# Patient Record
Sex: Male | Born: 1965 | Race: White | Hispanic: No | Smoking: Current every day smoker
Health system: Southern US, Community
[De-identification: ages and names within clinical notes are randomized; demographics above are authoritative.]

## PROBLEM LIST (undated history)

## (undated) DIAGNOSIS — M1990 Unspecified osteoarthritis, unspecified site: Secondary | ICD-10-CM

## (undated) DIAGNOSIS — E669 Obesity, unspecified: Secondary | ICD-10-CM

## (undated) DIAGNOSIS — E079 Disorder of thyroid, unspecified: Secondary | ICD-10-CM

## (undated) DIAGNOSIS — G809 Cerebral palsy, unspecified: Secondary | ICD-10-CM

## (undated) DIAGNOSIS — I10 Essential (primary) hypertension: Secondary | ICD-10-CM

## (undated) HISTORY — PX: TOOTH EXTRACTION: SUR596

## (undated) HISTORY — DX: Obesity, unspecified: E66.9

---

## 2001-01-31 ENCOUNTER — Emergency Department (HOSPITAL_COMMUNITY): Admission: EM | Admit: 2001-01-31 | Discharge: 2001-01-31 | Payer: Self-pay | Admitting: Emergency Medicine

## 2007-02-13 ENCOUNTER — Ambulatory Visit: Payer: Self-pay | Admitting: Family Medicine

## 2007-02-13 DIAGNOSIS — E039 Hypothyroidism, unspecified: Secondary | ICD-10-CM

## 2007-02-13 DIAGNOSIS — G4730 Sleep apnea, unspecified: Secondary | ICD-10-CM | POA: Insufficient documentation

## 2007-02-13 DIAGNOSIS — F341 Dysthymic disorder: Secondary | ICD-10-CM

## 2007-02-13 DIAGNOSIS — G809 Cerebral palsy, unspecified: Secondary | ICD-10-CM | POA: Insufficient documentation

## 2007-03-06 ENCOUNTER — Encounter (INDEPENDENT_AMBULATORY_CARE_PROVIDER_SITE_OTHER): Payer: Self-pay | Admitting: Family Medicine

## 2008-04-12 ENCOUNTER — Emergency Department: Payer: Self-pay | Admitting: Emergency Medicine

## 2008-09-16 ENCOUNTER — Ambulatory Visit (HOSPITAL_COMMUNITY): Admission: RE | Admit: 2008-09-16 | Discharge: 2008-09-16 | Payer: Self-pay | Admitting: Neurosurgery

## 2009-01-14 ENCOUNTER — Emergency Department (HOSPITAL_COMMUNITY): Admission: EM | Admit: 2009-01-14 | Discharge: 2009-01-14 | Payer: Self-pay | Admitting: Emergency Medicine

## 2009-03-12 ENCOUNTER — Emergency Department (HOSPITAL_COMMUNITY): Admission: EM | Admit: 2009-03-12 | Discharge: 2009-03-12 | Payer: Self-pay | Admitting: Emergency Medicine

## 2009-05-07 ENCOUNTER — Emergency Department (HOSPITAL_COMMUNITY): Admission: EM | Admit: 2009-05-07 | Discharge: 2009-05-07 | Payer: Self-pay | Admitting: Emergency Medicine

## 2009-05-22 ENCOUNTER — Ambulatory Visit: Payer: Self-pay | Admitting: Family Medicine

## 2009-05-22 DIAGNOSIS — F17200 Nicotine dependence, unspecified, uncomplicated: Secondary | ICD-10-CM | POA: Insufficient documentation

## 2009-05-22 DIAGNOSIS — I10 Essential (primary) hypertension: Secondary | ICD-10-CM

## 2009-05-22 LAB — CONVERTED CEMR LAB
ALT: 19 units/L (ref 0–53)
Alkaline Phosphatase: 64 units/L (ref 39–117)
CO2: 23 meq/L (ref 19–32)
Creatinine, Ser: 1.16 mg/dL (ref 0.40–1.50)
Sodium: 142 meq/L (ref 135–145)
Total Bilirubin: 0.5 mg/dL (ref 0.3–1.2)

## 2009-06-02 ENCOUNTER — Telehealth: Payer: Self-pay | Admitting: *Deleted

## 2009-07-07 ENCOUNTER — Ambulatory Visit: Payer: Self-pay | Admitting: Family Medicine

## 2009-07-07 LAB — CONVERTED CEMR LAB
Cholesterol: 217 mg/dL — ABNORMAL HIGH (ref 0–200)
LDL Cholesterol: 159 mg/dL — ABNORMAL HIGH (ref 0–99)
Total CHOL/HDL Ratio: 6.2
VLDL: 23 mg/dL (ref 0–40)

## 2009-08-25 ENCOUNTER — Ambulatory Visit: Payer: Self-pay | Admitting: Family Medicine

## 2009-08-25 DIAGNOSIS — N529 Male erectile dysfunction, unspecified: Secondary | ICD-10-CM | POA: Insufficient documentation

## 2009-09-23 ENCOUNTER — Encounter: Payer: Self-pay | Admitting: Family Medicine

## 2009-09-25 ENCOUNTER — Ambulatory Visit: Payer: Self-pay | Admitting: Family Medicine

## 2009-09-25 LAB — CONVERTED CEMR LAB
BUN: 7 mg/dL (ref 6–23)
Calcium: 9.3 mg/dL (ref 8.4–10.5)
Glucose, Bld: 94 mg/dL (ref 70–99)
Sodium: 142 meq/L (ref 135–145)
TSH: 2.374 microintl units/mL (ref 0.350–4.500)

## 2009-09-26 ENCOUNTER — Encounter: Payer: Self-pay | Admitting: Family Medicine

## 2009-10-03 ENCOUNTER — Encounter: Payer: Self-pay | Admitting: Family Medicine

## 2010-06-17 ENCOUNTER — Ambulatory Visit: Payer: Self-pay | Admitting: Family Medicine

## 2010-07-01 ENCOUNTER — Telehealth: Payer: Self-pay | Admitting: Family Medicine

## 2010-07-01 ENCOUNTER — Ambulatory Visit: Payer: Self-pay | Admitting: Family Medicine

## 2010-07-01 LAB — CONVERTED CEMR LAB
ALT: 17 units/L (ref 0–53)
AST: 16 units/L (ref 0–37)
Chloride: 104 meq/L (ref 96–112)
Creatinine, Ser: 0.95 mg/dL (ref 0.40–1.50)
Sodium: 142 meq/L (ref 135–145)
Total Bilirubin: 0.4 mg/dL (ref 0.3–1.2)

## 2010-07-10 ENCOUNTER — Telehealth: Payer: Self-pay | Admitting: *Deleted

## 2010-08-10 ENCOUNTER — Ambulatory Visit: Payer: Self-pay | Admitting: Family Medicine

## 2010-08-10 DIAGNOSIS — H5310 Unspecified subjective visual disturbances: Secondary | ICD-10-CM | POA: Insufficient documentation

## 2010-08-25 ENCOUNTER — Telehealth: Payer: Self-pay | Admitting: *Deleted

## 2010-08-27 ENCOUNTER — Encounter: Payer: Self-pay | Admitting: Family Medicine

## 2010-09-13 ENCOUNTER — Encounter: Payer: Self-pay | Admitting: Neurosurgery

## 2010-09-23 NOTE — Letter (Signed)
Summary: Generic Letter  [facility]  [address]   [place], [place] [address]   Phone: [phone]  Fax: [phone]    09/26/2009  JARTAVIOUS MCKIMMY [address] [place], [place]  [address]  Dear Mr. Deckard,   All your blood tests were normal.     Good luck with stopping smoking!!    Sincerely,   Pearlean Brownie MD  Appended Document: Generic Letter mailed.

## 2010-09-23 NOTE — Progress Notes (Signed)
Summary: phn msg  Phone Note Call from Patient Call back at Home Phone [phone]   Caller: Patient Summary of Call: pt is asking about pain mgmt Initial call taken by: De Nurse,  July 10, 2010 11:16 AM  Follow-up for Phone Call        Advised that we sent the referral on the 15th and that he should hear from them no later than next week.  Pt would like to know if he can be given something else for pain because he is taking two 800mg  ibuprofen at least two times a day. Advised pt that I would have to send message to MD for a med change. Follow-up by: Jone Baseman CMA,  July 10, 2010 11:31 AM  Additional Follow-up for Phone Call Additional follow up Details #1::        see referral note and recent phone call note that he was denied being seen at [facility] Additional Follow-up by: Pearlean Brownie MD,  July 10, 2010 2:10 PM    Additional Follow-up for Phone Call Additional follow up Details #2::    ok.  I can let him know that but what about the 1600mg s ibuprofen a day? Follow-up by: Jone Baseman CMA,  July 10, 2010 4:32 PM  Additional Follow-up for Phone Call Additional follow up Details #3:: Details for Additional Follow-up Action Taken: NO more than 800 mg three times a day and he can make an apt to discuss his pain  Additional Follow-up by: Pearlean Brownie MD,  July 13, 2010 3:41 PM  Pts wife informed of the above.  She will relay message. ............................................... Shanda Bumps [facility] July 13, 2010 3:50 PM

## 2010-09-23 NOTE — Assessment & Plan Note (Signed)
Summary: f/u  kh   Vital Signs:  Patient profile:   45 year old male Height:      64.5 inches Weight:      188.25 pounds BMI:     31.93 BSA:     1.92 Temp:     98.3 degrees F Pulse rate:   72 / minute BP sitting:   160 / 102  Vitals Entered By: Jone Baseman, CMA (July 01, 2010 11:22 AM) CC: f/u Is Patient Diabetic? No Pain Assessment Patient in pain? yes     Location: all over Intensity: 8   Primary Care Provider:  Pearlean Brownie MD  CC:  f/u.  History of Present Illness: Current Problems:  HYPERTENSION (ICD-401.9) taking his medications and knows the doses.  No chest pain or shortness of breath or lightheadedness  HYPOTHYROIDISM (ICD-244.9) taking replacement.  No heat intolerance or hair loss  SLEEP APNEA (ICD-780.57) needs his mask replaced is broken.  He feels that he does not rest well and may increase his blood pressure   DEGENERATIVE DISC DISEASE - C-SPINE AND L-SPINE (ICD-722.6) continues with pain in his back.  Would like referral to pain center.  Has taken nsaids before that helped.  No joint swelling or redness   ROS - as above PMH - Medications reviewed and updated in medication list.  Smoking Status noted in VS form    Habits & Providers  Alcohol-Tobacco-Diet     Tobacco Status: current     Tobacco Counseling: to quit use of tobacco products     Cigarette Packs/Day: 1.0  Current Medications (verified): 1)  Levothroid 50 Mcg Tabs (Levothyroxine Sodium) .Marland Kitchen.. 1 Daily For Thyroid 2)  Baclofen 20 Mg  Tabs (Baclofen) .Marland Kitchen.. 1 Three Times A Day For Spasm 3)  Omeprazole 20 Mg Cpdr (Omeprazole) .... One By Mouth Daily For Gerd 4)  Lisinopril 40 Mg Tabs (Lisinopril) .Marland Kitchen.. 1 By Mouth Two Times A Day 5)  Amlodipine Besylate 5 Mg  Tabs (Amlodipine Besylate) .Marland Kitchen.. 1 By Mouth Every Day For Blood Pressure  Allergies: 1)  ! Penicillin  Physical Exam  General:  Well-developed,well-nourished,in no acute distress; alert,appropriate and cooperative  throughout examination.  Able to move around and get up and down from exam table without assistance  Lungs:  Normal respiratory effort, chest expands symmetrically. Lungs are clear to auscultation, no crackles or wheezes. Heart:  Normal rate and regular rhythm. S1 and S2 normal without gallop, murmur, click, rub or other extra sounds.   Impression & Recommendations:  Problem # 1:  HYPERTENSION (ICD-401.9) not well controlled.  Increase amlodipine.  Check labs.  May be due to sleep apnea since not able to use his cpap His updated medication list for this problem includes:    Lisinopril 40 Mg Tabs (Lisinopril) .Marland Kitchen... 1 by mouth two times a day    Amlodipine Besylate 10 Mg Tabs (Amlodipine besylate) .Marland Kitchen... 1 daily for blood pressure  Orders: [facility] ([address]) [facility]- Est  Level 4 ([address])  BP today: 160/102 Prior BP: 168/108 (06/17/2010)  Labs Reviewed: K+: 4.1 (09/25/2009) Creat: : 0.97 (09/25/2009)   Chol: 217 (07/07/2009)   HDL: 35 (07/07/2009)   LDL: 159 (07/07/2009)   TG: 116 (07/07/2009)  Problem # 2:  SLEEP APNEA (ICD-780.57)  we unfortunately do not have records of his sleep study or settings.  Will need to obtain in order to meet requirements to reorder   Orders: [facility]- Est  Level 4 ([address]56)  Problem # 3:  DEGENERATIVE DISC DISEASE -  C-SPINE AND L-SPINE (ICD-722.6)  He will call us with name of pain center.  Will treat with ibuprofen   Orders: [facility]- Est  Level 4 ([address])  Complete Medication List: 1)  Levothroid 50 Mcg Tabs (Levothyroxine sodium) .Marland Kitchen.. 1 daily for thyroid 2)  Baclofen 20 Mg Tabs (Baclofen) .Marland Kitchen.. 1 three times a day for spasm 3)  Omeprazole 20 Mg Cpdr (Omeprazole) .... One by mouth daily for gerd 4)  Lisinopril 40 Mg Tabs (Lisinopril) .Marland Kitchen.. 1 by mouth two times a day 5)  Amlodipine Besylate 10 Mg Tabs (Amlodipine besylate) .Marland Kitchen.. 1 daily for blood pressure 6)  Ibuprofen 800 Mg Tabs (Ibuprofen) .Marland Kitchen.. 1 three times a day as needed with food  Patient  Instructions: 1)  Please schedule a follow-up appointment in 1 month.  2)  I will call you if your lab is abnormal otherwise I will discuss the results during our next visit 3)  Call me with the name of the Pain Center 4)  I increased the dose of your amlodipine  Prescriptions: IBUPROFEN 800 MG TABS (IBUPROFEN) 1 three times a day as needed with food  #90 x 1   Entered and Authorized by:   Pearlean Brownie MD   Signed by:   Pearlean Brownie MD on 07/01/2010   Method used:   Electronically to        [facility] [address] [phone]* (retail)       [address]       [place], [place]  [address]       Ph: [phone]       Fax: [phone]   RxID:   [id number] AMLODIPINE BESYLATE 10 MG TABS (AMLODIPINE BESYLATE) 1 daily for blood pressure  #30 x 1   Entered and Authorized by:   Pearlean Brownie MD   Signed by:   Pearlean Brownie MD on 07/01/2010   Method used:   Electronically to        [facility] [address] [phone]* (retail)       [address]       [place], [place]  [address]       Ph: [phone]       Fax: [phone]   RxID:   [id number]    Orders Added: 1)  Comp [facility] [[address]3235] 2)  [facility]- Est  Level 4 [[address]]

## 2010-09-23 NOTE — Consult Note (Signed)
Summary: [facility]   Imported By: Clydell Hakim 10/07/2009 15:16:01  _____________________________________________________________________  External Attachment:    Type:   Image     Comment:   External Document

## 2010-09-23 NOTE — Assessment & Plan Note (Signed)
Summary: F/U  KH   Vital Signs:  Patient profile:   45 year old male Height:      64.5 inches Weight:      180 pounds BMI:     30.53 Temp:     97.8 degrees F oral Pulse rate:   57 / minute BP sitting:   168 / 108  (left arm) Cuff size:   regular  Vitals Entered By: Garen Grams LPN (June 17, 2010 11:07 AM) CC: f/u Is Patient Diabetic? No   Primary Care Idalia Allbritton:  Pearlean Brownie MD  CC:  f/u.  History of Present Illness: HTN Disease Monitoring   Blood pressure range:not checking       Chest pain: N     Dyspnea:N Medications   Compliance: has been out of his medications for weeks.   Lightheadedness: N     Edema:N  HYPOTHYROIDISM (ICD-244.9) has been feeling tired. Out of his thyroid replacement for weeks.    DEGENERATIVE DISC DISEASE - C-SPINE AND L-SPINE (ICD-722.6) has pain in back as usual. Out of pain medications. Was discharged from pain center in [place] for No Show.  Was at pain center in [place] where he lived for the last 6 months but now is back in [place].     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  Reports his spasms are worse without it   ROS - as above PMH - Medications reviewed and updated in medication list.  Smoking Status noted in VS form  SH living in a relatives home.  No income.       Habits & Providers  Alcohol-Tobacco-Diet     Tobacco Status: current     Tobacco Counseling: to quit use of tobacco products     Cigarette Packs/Day: 1.0  Allergies: 1)  ! Penicillin  Physical Exam  General:  Well-developed,well-nourished,in no acute distress; alert,appropriate and cooperative throughout examination Lungs:  Normal respiratory effort, chest expands symmetrically. Lungs are clear to auscultation, no crackles or wheezes. Heart:  Normal rate and regular rhythm. S1 and S2 normal without gallop, murmur, click, rub or other extra sounds. Extremities:  no edema Neurologic:  able to get up and down from exam table quickly without asst and without  obvious pain    Impression & Recommendations:  Problem # 1:  HYPERTENSION (ICD-401.9) Assessment Deteriorated  Poorly controlled because out of medications.  He will contact [employer] to help with housing and assistance.  I represcribed all his medications.  Will needs labs next visit  His updated medication list for this problem includes:    Lisinopril 40 Mg Tabs (Lisinopril) .Marland Kitchen... 1 by mouth two times a day    Amlodipine Besylate 5 Mg Tabs (Amlodipine besylate) .Marland Kitchen... 1 by mouth every day for blood pressure  BP today: 168/108 Prior BP: 149/91 (09/25/2009)  Labs Reviewed: K+: 4.1 (09/25/2009) Creat: : 0.97 (09/25/2009)   Chol: 217 (07/07/2009)   HDL: 35 (07/07/2009)   LDL: 159 (07/07/2009)   TG: 116 (07/07/2009)  Orders: [facility]- Est  Level 4 ([address])  Problem # 2:  HYPOTHYROIDISM (ICD-244.9)  restart replacement His updated medication list for this problem includes:    Levothroid 50 Mcg Tabs (Levothyroxine sodium) .Marland Kitchen... 1 daily for thyroid  Orders: [facility]- Est  Level 4 ([address]24)  Problem # 3:  DEGENERATIVE DISC DISEASE - C-SPINE AND L-SPINE (ICD-722.6) Assessment: Unchanged  He seems to be able to move relatively well despite being out of analgesics.  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 I suggested he contact Pain Centers and that I would refer  him once his blood pressure was better conterolled.  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  Will restart baclofen   Complete Medication List: 1)  Levothroid 50 Mcg Tabs (Levothyroxine sodium) .Marland Kitchen.. 1 daily for thyroid 2)  Baclofen 20 Mg Tabs (Baclofen) .Marland Kitchen.. 1 three times a day for spasm 3)  Omeprazole 20 Mg Cpdr (Omeprazole) .... One by mouth daily for gerd 4)  Lisinopril 40 Mg Tabs (Lisinopril) .Marland Kitchen.. 1 by mouth two times a day 5)  Amlodipine Besylate 5 Mg Tabs (Amlodipine besylate) .Marland Kitchen.. 1 by mouth every day for blood  pressure  Patient Instructions: 1)  Please schedule a follow-up appointment in 2-4 weeks.  2)  Take all your medications as prescribed 3)  Once we get your blood pressure under control we will refer you to a pain center and neurology Prescriptions: BACLOFEN 20 MG  TABS (BACLOFEN) 1 three times a day for spasm  #90 x 3   Entered and Authorized by:   Pearlean Brownie MD   Signed by:   Pearlean Brownie MD on 06/17/2010   Method used:   Electronically to        [facility] [address] [phone]* (retail)       [address]       [place], [place]  [address]       Ph: [phone]       Fax: [phone]   RxID:   [id number] AMLODIPINE BESYLATE 5 MG  TABS (AMLODIPINE BESYLATE) 1 by mouth every day for blood pressure  #30 x 3   Entered and Authorized by:   Pearlean Brownie MD   Signed by:   Pearlean Brownie MD on 06/17/2010   Method used:   Electronically to        [facility] [address] [phone]* (retail)       [address]       [place], [place]  [address]       Ph: [phone]       Fax: [phone]   RxID:   [id number] LISINOPRIL 40 MG TABS (LISINOPRIL) 1 by mouth two times a day  #30 x 3   Entered and Authorized by:   Pearlean Brownie MD   Signed by:   Pearlean Brownie MD on 06/17/2010   Method used:   Electronically to        [facility] [address] [phone]* (retail)       [address]       [place], [place]  [address]       Ph: [phone]       Fax: [phone]   RxID:   [id number] OMEPRAZOLE 20 MG CPDR (OMEPRAZOLE) one by mouth daily for GERD  #30 x 3   Entered and Authorized by:   Pearlean Brownie MD   Signed by:   Pearlean Brownie MD on 06/17/2010   Method used:   Electronically to        [facility] [address] [phone]* (retail)       [address]       [place], [place]  [address]       Ph: [phone]       Fax: [phone]   RxID:   [id number] LEVOTHROID 50 MCG TABS (LEVOTHYROXINE SODIUM) 1 daily for thyroid  #30 x 3   Entered and  Authorized by:    Pearlean Brownie MD   Signed by:   Pearlean Brownie MD on 06/17/2010   Method used:   Electronically to        [facility] [address] [phone]* (retail)       [address]       [place], [place]  [address]       Ph: [phone]       Fax: [phone]   RxID:   [id number]    Orders Added: 1)  [facility]- Est  Level 4 [[address]]

## 2010-09-23 NOTE — Assessment & Plan Note (Signed)
Summary: f/u eo   Vital Signs:  Patient profile:   45 year old male Height:      64.5 inches Weight:      192.7 pounds BMI:     32.68 Temp:     98.4 degrees F oral Pulse rate:   86 / minute BP sitting:   149 / 91  (left arm) Cuff size:   large  Vitals Entered By: Garen Grams LPN (September 25, 2009 11:25 AM) CC: f/u BP Is Patient Diabetic? No Pain Assessment Patient in pain? no        Primary Care Provider:  Pearlean Brownie MD  CC:  f/u BP.  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   HYPERTENSION (ICD-401.9) HYPERTENSION Disease Monitoring   Blood pressure range: not checking      Chest pain: N     Dyspnea:N Medications   Compliance: daily   Lightheadedness: N     Edema:N  HYPOTHYROIDISM (ICD-244.9) back on medication.  No heat sensitivity or hair changes  ROS - as above PMH - Medications reviewed and updated in medication list.  Smoking Status noted in VS form    Habits & Providers  Alcohol-Tobacco-Diet     Tobacco Status: current     Cigarette Packs/Day: 1.0  Current Medications (verified): 1)  Levothroid 50 Mcg Tabs (Levothyroxine Sodium) .Marland Kitchen.. 1 Daily For Thyroid 2)  Baclofen 20 Mg  Tabs (Baclofen) .Marland Kitchen.. 1 Three Times A Day For Spasm 3)  Omeprazole 20 Mg Cpdr (Omeprazole) .... One By Mouth Daily For Gerd 4)  Clonazepam 1 Mg Tabs (Clonazepam) .Marland Kitchen.. 1 By Mouth Two Times A Day For Anxiety 5)  Hydrocodone-Acetaminophen 10-325 Mg Tabs (Hydrocodone-Acetaminophen) .... Qid Per [facility] 6)  Lisinopril 40 Mg Tabs (Lisinopril) .Marland Kitchen.. 1 By Mouth Two Times A Day 7)  Amlodipine Besylate 5 Mg  Tabs (Amlodipine Besylate) .Marland Kitchen.. 1 By Mouth Every Day For Blood Pressure  Allergies: 1)  ! Penicillin  Physical Exam  General:  Well-developed,well-nourished,in no acute distress; alert,appropriate and  cooperative throughout examination Neurologic:  Finger to nose normal bilaterally.  Can do coordinated flexion extension of ankles bilaterally normal strength    Impression & Recommendations:  Problem # 1:  ERECTILE DYSFUNCTION, ORGANIC (ICD-607.84) Assessment Improved  Orders: [facility]- Est  Level 4 ([address])  Problem # 2:  HYPERTENSION (ICD-401.9) Better but not adequate control.  Will add amlodipine and follow.  check labs for creatinine today  His updated medication list for this problem includes:    Lisinopril 40 Mg Tabs (Lisinopril) .Marland Kitchen... 1 by mouth two times a day    Amlodipine Besylate 5 Mg Tabs (Amlodipine besylate) .Marland Kitchen... 1 by mouth every day for blood pressure  Orders: [facility] ([address]) [facility]- Est  Level 4 ([address])  BP today: 149/91 Prior BP: 172/96 (08/25/2009)  Labs Reviewed: K+: 3.4 (05/22/2009) Creat: : 1.16 (05/22/2009)   Chol: 217 (07/07/2009)   HDL: 35 (07/07/2009)   LDL: 159 (07/07/2009)   TG: 116 (07/07/2009)  Problem # 3:  TOBACCO ABUSE (ICD-305.1) Had stoppd for 2 years with patch.  recommed he try that again  Problem # 4:  HYPOTHYROIDISM (ICD-244.9) Will check TSH  His updated medication list for this problem includes:    Levothroid 50 Mcg Tabs (Levothyroxine sodium) .Marland Kitchen... 1 daily for thyroid  Orders: [facility] ([address]-78469) [facility]- Est  Level 4 ([address])  Complete Medication List: 1)  Levothroid 50 Mcg Tabs (Levothyroxine sodium) .Marland Kitchen.. 1 daily for thyroid 2)  Baclofen 20 Mg Tabs (Baclofen) .Marland Kitchen.. 1 three times a day for spasm 3)  Omeprazole 20 Mg Cpdr (Omeprazole) .... One by mouth daily for gerd 4)  Clonazepam 1 Mg Tabs (Clonazepam) .Marland Kitchen.. 1 by mouth two times a day for anxiety 5)  Hydrocodone-acetaminophen 10-325 Mg Tabs (Hydrocodone-acetaminophen) .... Qid per [facility] 6)  Lisinopril 40 Mg Tabs (Lisinopril) .Marland Kitchen.. 1 by mouth two times a day 7)  Amlodipine Besylate 5 Mg Tabs (Amlodipine besylate) .Marland Kitchen.. 1 by mouth every day for blood  pressure  Patient Instructions: 1)  Please schedule a follow-up appointment in 2 months.  2)  Take the amlodipine every day with your lisinopril 3)  Call if feel really lightheaded when stand up 4)  I will call you if your lab is abnormal otherwise I will send you a letter within 2 weeks. 5)  Talk with [facility] about [facility]ant[facility] 6)  Use the patch to stop smoking 7)  Stop smoking tips: Choose a quit date. Cut down before the quit date. Decide what you will do as a substitute when you feel the urge to smoke(gum, toothpick, exercise).  Prescriptions: AMLODIPINE BESYLATE 5 MG  TABS (AMLODIPINE BESYLATE) 1 by mouth every day for blood pressure  #30 x 3   Entered and Authorized by:   Pearlean Brownie MD   Signed by:   Pearlean Brownie MD on 09/25/2009   Method used:   Electronically to        [facility]* (retail)       [address]       [place], [place]  [address]       Ph: [phone]       Fax: [phone]   RxID:   [id number]

## 2010-09-23 NOTE — Progress Notes (Signed)
Summary: Pain Center Referral - Declined to see him  Phone Note Call from Patient Call back at Home Phone [phone]   Caller: Patient Reason for Call: Lab or Test Results Summary of Call: called to give info -  [facility] [phone] Initial call taken by: De Nurse,  July 01, 2010 1:57 PM  Follow-up for Phone Call        Letter from Pain Center Dr Murray Hodgkins declined to see him Follow-up by: Pearlean Brownie MD,  July 09, 2010 5:09 PM

## 2010-09-23 NOTE — Assessment & Plan Note (Signed)
Summary: f/u,df   Vital Signs:  Patient profile:   45 year old male Height:      64.5 inches Weight:      187 pounds BMI:     31.72 BSA:     1.91 Temp:     98.4 degrees F Pulse rate:   65 / minute BP sitting:   172 / 96  Vitals Entered By: Jone Baseman CMA (August 25, 2009 1:38 PM) CC: f/u Is Patient Diabetic? No Pain Assessment Patient in pain? yes     Location: back Intensity: 4   Primary Care Provider:  Pearlean Brownie MD  CC:  f/u.  History of Present Illness: Impotence worse with norvasc he stopped that.  has had problems in the past.  would like to try Viagra.  He and his wife hope to be pregnant  HYPERTENSION (ICD-401.9) Disease Monitoring   Blood pressure range:no money for bp cuff      Chest pain: N     Dyspnea:N Medications   Compliance: stopped norvasc because of impotence   Lightheadedness: N     Edema:N Prevention   Exercise: walks some    Salt restriction:does not eat much  HYPOTHYROIDISM (ICD-244.9) out of medications for last month.  No change in hair or heat tolerance  TOBACCO ABUSE (ICD-305.1) not really interested in stopping ROS - as above PMH - Medications reviewed and updated in medication list.  Smoking Status noted in VS form      Habits & Providers  Alcohol-Tobacco-Diet     Tobacco Status: current     Tobacco Counseling: to quit use of tobacco products     Cigarette Packs/Day: 1.0  Current Medications (verified): 1)  Lisinopril-Hydrochlorothiazide 20-12.5 Mg  Tabs (Lisinopril-Hydrochlorothiazide) .... 2 By Mouth Once Daily 2)  Levothroid 50 Mcg Tabs (Levothyroxine Sodium) .Marland Kitchen.. 1 Daily For Thyroid 3)  Baclofen 20 Mg  Tabs (Baclofen) .Marland Kitchen.. 1 Three Times A Day For Spasm 4)  Omeprazole 20 Mg Cpdr (Omeprazole) .... One By Mouth Daily For Gerd 5)  Clonazepam 1 Mg Tabs (Clonazepam) .Marland Kitchen.. 1 By Mouth Two Times A Day For Anxiety 6)  Hydrocodone-Acetaminophen 10-325 Mg Tabs (Hydrocodone-Acetaminophen) .... Qid Per [facility]  [place]  Allergies: 1)  ! Penicillin  Social History: Packs/Day:  1.0  Physical Exam  General:  Well-developed,well-nourished,in no acute distress; alert,appropriate and cooperative throughout examination   Impression & Recommendations:  Problem # 1:  HYPERTENSION (ICD-401.9)  Not well controlled will change medications  and follow The following medications were removed from the medication list:    Lisinopril-hydrochlorothiazide 20-12.5 Mg Tabs (Lisinopril-hydrochlorothiazide) .Marland Kitchen... 2 by mouth once daily    Amlodipine Besylate 5 Mg Tabs (Amlodipine besylate) .Marland Kitchen... 1 daily His updated medication list for this problem includes:    Lisinopril 40 Mg Tabs (Lisinopril) .Marland Kitchen... 1 by mouth two times a day  BP today: 172/96 Prior BP: 182/111 (07/07/2009)  Labs Reviewed: K+: 3.4 (05/22/2009) Creat: : 1.16 (05/22/2009)   Chol: 217 (07/07/2009)   HDL: 35 (07/07/2009)   LDL: 159 (07/07/2009)   TG: 116 (07/07/2009)  Orders: [facility]- Est  Level 4 (81191)  Problem # 2:  ERECTILE DYSFUNCTION, ORGANIC (ICD-607.84)  will see if improves with change in blood pressure medications and restart thyroid replacement.  Told would not use viagra until blood pressure is under control due to risk of cva   Orders: [facility]- Est  Level 4 ([address]214)  Problem # 3:  HYPOTHYROIDISM (ICD-244.9) Assessment: Deteriorated  will need to resume medications  His  updated medication list for this problem includes:    Levothroid 50 Mcg Tabs (Levothyroxine sodium) .Marland Kitchen... 1 daily for thyroid  Orders: [facility]- Est  Level 4 ([address])  Problem # 4:  TOBACCO ABUSE (ICD-305.1) discussed advantages of stopping .  His wife smokes also   Complete Medication List: 1)  Levothroid 50 Mcg Tabs (Levothyroxine sodium) .Marland Kitchen.. 1 daily for thyroid 2)  Baclofen 20 Mg Tabs (Baclofen) .Marland Kitchen.. 1 three times a day for spasm 3)  Omeprazole 20 Mg Cpdr (Omeprazole) .... One by mouth daily for gerd 4)  Clonazepam 1 Mg Tabs (Clonazepam) .Marland Kitchen.. 1 by mouth two  times a day for anxiety 5)  Hydrocodone-acetaminophen 10-325 Mg Tabs (Hydrocodone-acetaminophen) .... Qid per [facility] [place] 6)  Lisinopril 40 Mg Tabs (Lisinopril) .Marland Kitchen.. 1 by mouth two times a day  Patient Instructions: 1)  Please schedule a follow-up appointment in 1 month.  2)  Take your pills every day same time of day 3)  Tobacco is very bad for your health and your loved ones ! You should stop smoking !  4)  Walk every day 5)  Cut back on any fatty foods or high cholesterol  Prescriptions: LEVOTHROID 50 MCG TABS (LEVOTHYROXINE SODIUM) 1 daily for thyroid  #90 x 3   Entered and Authorized by:   Pearlean Brownie MD   Signed by:   Pearlean Brownie MD on 08/25/2009   Method used:   Electronically to        [facility]* (retail)       [address] [place], [place]  [address]       Ph: [phone]       Fax: [phone]   RxID:   [id number] LISINOPRIL 40 MG TABS (LISINOPRIL) 1 by mouth two times a day  #60 x 2   Entered and Authorized by:   Pearlean Brownie MD   Signed by:   Pearlean Brownie MD on 08/25/2009   Method used:   Electronically to        [facility]* (retail)       [address]       [place], [place]  [address]       Ph: [phone]       Fax: [phone]   RxID:   [id number]    Prevention & Chronic Care Immunizations   Influenza vaccine: Fluvax MCR  (07/07/2009)    Tetanus booster: Not documented    Pneumococcal vaccine: Not documented  Other Screening   Smoking status: current  (08/25/2009)   Smoking cessation counseling: YES  (08/25/2009)  Lipids   Total Cholesterol: 217  (07/07/2009)   LDL: 159  (07/07/2009)   LDL Direct: Not documented   HDL: 35  (07/07/2009)   Triglycerides: 116  (07/07/2009)  Hypertension   Last Blood Pressure: 172 / 96  (08/25/2009)   Serum creatinine: 1.16  (05/22/2009)   Serum potassium 3.4  (05/22/2009)     Hypertension flowsheet reviewed?: Yes   Progress toward BP goal: Unchanged  Self-Management Support :   Personal Goals (by the next clinic visit) :      Personal blood pressure goal: 140/90  (07/07/2009)   Patient will work on the following items until the next clinic visit to reach self-care goals:     Medications and monitoring: take my medicines every day  (08/25/2009)     Eating:  drink diet soda or water instead of juice or soda  (08/25/2009)     Other: Add a salt substitute   (08/25/2009)    Hypertension self-management support: BP self-monitoring log, Written self-care plan  (08/25/2009)   Hypertension self-care plan printed.

## 2010-09-23 NOTE — Letter (Signed)
Summary: [place] [employer]  [place] [employer]   Imported By: Clydell Hakim 10/06/2009 14:56:14  _____________________________________________________________________  External Attachment:    Type:   Image     Comment:   External Document

## 2010-09-24 NOTE — Consult Note (Signed)
Summary: [facility]  [facility]   Imported By: De Nurse 09/02/2010 15:33:22  _____________________________________________________________________  External Attachment:    Type:   Image     Comment:   External Document

## 2010-09-24 NOTE — Progress Notes (Signed)
Summary: Referral  Phone Note Call from Patient Call back at Home Phone [phone]   Reason for Call: Referral Summary of Call: pt says MD found him another pain management doctor in [place], he needs to know the date & time of appt, did not see appt in EMR, also wants to know when hes suppose to see the eye doctor? Initial call taken by: Knox Royalty,  August 25, 2010 9:12 AM  Follow-up for Phone Call        Informed pt of appointment at [facility], i dont see anything about a pain center appt in [place], message to MD to see if he knows anything about this. Follow-up by: Garen Grams LPN,  August 25, 2010 11:41 AM  Additional Follow-up for Phone Call Additional follow up Details #1::        If you View  the Pain Center referral it looks like Tonya faxed it to a number that is likely the one in [place]?  thanks  [facility]    Additional Follow-up for Phone Call Additional follow up Details #2::    Appt scheduled with [facility] for 10/01/10 at 745am, patient informed. Follow-up by: Garen Grams LPN,  August 25, 2010 3:20 PM

## 2010-09-24 NOTE — Assessment & Plan Note (Signed)
Summary: F/U  KH   Vital Signs:  Patient profile:   45 year old male Height:      64.5 inches Weight:      191.2 pounds BMI:     32.43 Temp:     98.6 degrees F oral Pulse rate:   68 / minute BP sitting:   163 / 93  (right arm) Cuff size:   regular  Vitals Entered By: Jimmy Footman, CMA (August 10, 2010 2:21 PM) CC: BP follow up Is Patient Diabetic? No Pain Assessment Patient in pain? yes     Location: back Intensity: 8 Type: sharp   Primary Care Provider:  Pearlean Brownie MD  CC:  BP follow up.  History of Present Illness: Current Problems:  HYPERTENSION (ICD-401.9) taking his medications and knows the doses.  No chest pain or shortness of breath or lightheadedness  SLEEP APNEA (ICD-780.57) Still needs his mask replaced because is broken.  He feels that he does not rest well and may increase his blood pressure   DEGENERATIVE DISC DISEASE - C-SPINE AND L-SPINE (ICD-722.6) continues with pain in his back.  Would like referral to another pain center- knows one in [place].  He does not think the current meds help much and he is having to use his electric scooter more due to pain.  No joint swelling or redness  Vision seeing wavy lines since does not have his glasses which helped when he wore them in the past  ROS - as above PMH - Medications reviewed and updated in medication list.  Smoking Status noted in VS form    Habits & Providers  Alcohol-Tobacco-Diet     Tobacco Status: current  Current Medications (verified): 1)  Levothroid 50 Mcg Tabs (Levothyroxine Sodium) .Marland Kitchen.. 1 Daily For Thyroid 2)  Baclofen 20 Mg  Tabs (Baclofen) .Marland Kitchen.. 1 Three Times A Day For Spasm 3)  Omeprazole 20 Mg Cpdr (Omeprazole) .... One By Mouth Daily For Gerd 4)  Lisinopril 40 Mg Tabs (Lisinopril) .Marland Kitchen.. 1 By Mouth Two Times A Day 5)  Amlodipine Besylate 10 Mg Tabs (Amlodipine Besylate) .Marland Kitchen.. 1 Daily For Blood Pressure 6)  Ibuprofen 800 Mg Tabs (Ibuprofen) .Marland Kitchen.. 1 Three Times A Day As Needed  With Food  Allergies: 1)  ! Penicillin  Social History: Occupation: disabled due to CP Melvin Nguyen- 1990 wife Has a motorized scooter Alcohol use-no Drug use-no  Physical Exam  General:  Well-developed,well-nourished,in no acute distress; alert,appropriate and cooperative throughout examination.  Able to move around and get up and down from exam table without assistance  Eyes:  No corneal or conjunctival inflammation noted. EOMI. Perrla. Funduscopic exam poorly seen but benign, without hemorrhages, exudates or papilledema. Vision grossly normal.   Impression & Recommendations:  Problem # 1:  HYPERTENSION (ICD-401.9)  Will monitor his blood pressure is not improving will add hctz  His updated medication list for this problem includes:    Lisinopril 40 Mg Tabs (Lisinopril) .Marland Kitchen... 1 by mouth two times a day    Amlodipine Besylate 10 Mg Tabs (Amlodipine besylate) .Marland Kitchen... 1 daily for blood pressure  BP today: 163/93 Prior BP: 160/102 (07/01/2010)  Labs Reviewed: K+: 4.3 (07/01/2010) Creat: : 0.95 (07/01/2010)   Chol: 217 (07/07/2009)   HDL: 35 (07/07/2009)   LDL: 159 (07/07/2009)   TG: 116 (07/07/2009)  Orders: [facility]- Est  Level 4 ([address])  Problem # 2:  DEGENERATIVE DISC DISEASE - C-SPINE AND L-SPINE (ICD-722.6)  Will refer to pain center per his wishes.   Continue  current medications  Orders: [facility]- Est  Level 4 ([address]214)  Problem # 3:  SLEEP APNEA (ICD-780.57) wrote for a mask replacement  Problem # 4:  VISUAL IMPAIRMENT (ICD-368.10) refer for evaluation and glasses  Orders: Ophthalmology Referral (Ophthalmology) [facility]- Est  Level 4 ([address]38)  Complete Medication List: 1)  Levothroid 50 Mcg Tabs (Levothyroxine sodium) .Marland Kitchen.. 1 daily for thyroid 2)  Baclofen 20 Mg Tabs (Baclofen) .Marland Kitchen.. 1 three times a day for spasm 3)  Omeprazole 20 Mg Cpdr (Omeprazole) .... One by mouth daily for gerd 4)  Lisinopril 40 Mg Tabs (Lisinopril) .Marland Kitchen.. 1 by mouth two times a day 5)  Amlodipine  Besylate 10 Mg Tabs (Amlodipine besylate) .Marland Kitchen.. 1 daily for blood pressure 6)  Ibuprofen 800 Mg Tabs (Ibuprofen) .Marland Kitchen.. 1 three times a day as needed with food  Patient Instructions: 1)  Please schedule a follow-up appointment in 1-2 month.  2)  Check your blood pressure once a week.  If regularly > 140/90 then call and we will start HCTZ 3)  Will we refer you to the [place] [facility] 4)  Call if you have any trouble getting the CPAP mask 5)  Will call about an eye referral   Orders Added: 1)  Ophthalmology Referral [Ophthalmology] 2)  [facility]- Est  Level 4 [[address]]     Prevention & Chronic Care Immunizations   Influenza vaccine: Fluvax MCR  (07/07/2009)    Tetanus booster: Not documented    Pneumococcal vaccine: Not documented  Other Screening   Smoking status: current  (08/10/2010)   Smoking cessation counseling: YES  (08/25/2009)  Lipids   Total Cholesterol: 217  (07/07/2009)   LDL: 159  (07/07/2009)   LDL Direct: Not documented   HDL: 35  (07/07/2009)   Triglycerides: 116  (07/07/2009)  Hypertension   Last Blood Pressure: 163 / 93  (08/10/2010)   Serum creatinine: 0.95  (07/01/2010)   Serum potassium 4.3  (07/01/2010)    Hypertension flowsheet reviewed?: Yes   Progress toward BP goal: Unchanged  Self-Management Support :   Personal Goals (by the next clinic visit) :      Personal blood pressure goal: 140/90  (07/07/2009)   Hypertension self-management support: BP self-monitoring log, Written self-care plan  (08/25/2009)

## 2010-09-28 ENCOUNTER — Ambulatory Visit (INDEPENDENT_AMBULATORY_CARE_PROVIDER_SITE_OTHER): Payer: Medicare Other | Admitting: Family Medicine

## 2010-09-28 ENCOUNTER — Encounter: Payer: Self-pay | Admitting: Family Medicine

## 2010-09-28 DIAGNOSIS — I10 Essential (primary) hypertension: Secondary | ICD-10-CM

## 2010-09-28 DIAGNOSIS — G4730 Sleep apnea, unspecified: Secondary | ICD-10-CM

## 2010-09-28 DIAGNOSIS — IMO0002 Reserved for concepts with insufficient information to code with codable children: Secondary | ICD-10-CM

## 2010-10-01 ENCOUNTER — Other Ambulatory Visit: Payer: Self-pay | Admitting: Family Medicine

## 2010-10-01 NOTE — Telephone Encounter (Signed)
Please review and refill

## 2010-10-08 NOTE — Assessment & Plan Note (Signed)
Summary: routine visit/eo   Vital Signs:  Patient profile:   45 year old male Height:      64.5 inches Weight:      194.56 pounds BMI:     33.00 BSA:     1.95 Temp:     97.4 degrees F Pulse rate:   84 / minute BP sitting:   151 / 94  Vitals Entered By: Jone Baseman CMA (September 28, 2010 2:47 PM) CC: routine visit Is Patient Diabetic? No Pain Assessment Patient in pain? yes     Location: all over Intensity: 6   Primary Care Provider:  Pearlean Brownie MD  CC:  routine visit.  History of Present Illness:  HYPERTENSION (ICD-401.9) No chest pain or shortness of breath or lightheadedness.  He measured his blood pressure several times and always elevated.  Taking all medications regularly  SLEEP APNEA (ICD-780.57) does not have equipment.  Wife relates he snores and often stops breathing for short periods. Old sleep study shows severe disease  DEGENERATIVE DISC DISEASE - C-SPINE AND L-SPINE (ICD-722.6) Continues to complain of pain and wants referral to pain center.  Able to move around exam room and halls without evident pain  ROS - as above PMH - Medications reviewed and updated in medication list.  Smoking Status noted in VS form    Habits & Providers  Alcohol-Tobacco-Diet     Tobacco Status: current     Tobacco Counseling: to quit use of tobacco products     Cigarette Packs/Day: 1.0  Current Medications (verified): 1)  Levothroid 50 Mcg Tabs (Levothyroxine Sodium) .Marland Kitchen.. 1 Daily For Thyroid 2)  Baclofen 20 Mg  Tabs (Baclofen) .Marland Kitchen.. 1 Three Times A Day For Spasm 3)  Omeprazole 20 Mg Cpdr (Omeprazole) .... One By Mouth Daily For Gerd 4)  Lisinopril 40 Mg Tabs (Lisinopril) .Marland Kitchen.. 1 By Mouth Two Times A Day 5)  Amlodipine Besylate 10 Mg Tabs (Amlodipine Besylate) .Marland Kitchen.. 1 Daily For Blood Pressure 6)  Ibuprofen 800 Mg Tabs (Ibuprofen) .Marland Kitchen.. 1 Three Times A Day As Needed With Food  Allergies: 1)  ! Penicillin  Physical Exam  General:   Well-developed,well-nourished,in no acute distress; alert,appropriate and cooperative throughout examination   Impression & Recommendations:  Problem # 1:  HYPERTENSION (ICD-401.9)  not well controlled.  Will add hctz  His updated medication list for this problem includes:    Lisinopril 40 Mg Tabs (Lisinopril) .Marland Kitchen... 1 by mouth two times a day    Amlodipine Besylate 10 Mg Tabs (Amlodipine besylate) .Marland Kitchen... 1 daily for blood pressure    Hydrochlorothiazide 25 Mg Tabs (Hydrochlorothiazide) .Marland Kitchen... 1 daily  BP today: 151/94 Prior BP: 163/93 (08/10/2010)  Labs Reviewed: K+: 4.3 (07/01/2010) Creat: : 0.95 (07/01/2010)   Chol: 217 (07/07/2009)   HDL: 35 (07/07/2009)   LDL: 159 (07/07/2009)   TG: 116 (07/07/2009)  Orders: [facility]- Est  Level 4 ([address])  Problem # 2:  SLEEP APNEA (ICD-780.57)  no controlled Wrote Rx for cpap.  Sleep study showed severe disease  Orders: [facility]- Est  Level 4 ([address])  Problem # 3:  DEGENERATIVE DISC DISEASE - C-SPINE AND L-SPINE (ICD-722.6) Assessment: Improved continues to have subjective pain.  I will refer to pain center for evaluation  Orders: [facility]- Est  Level 4 ([address])  Complete Medication List: 1)  Levothroid 50 Mcg Tabs (Levothyroxine sodium) .Marland Kitchen.. 1 daily for thyroid 2)  Baclofen 20 Mg Tabs (Baclofen) .Marland Kitchen.. 1 three times a day for spasm 3)  Omeprazole 20 Mg Cpdr (Omeprazole) .Marland KitchenMarland KitchenMarland Kitchen  One by mouth daily for gerd 4)  Lisinopril 40 Mg Tabs (Lisinopril) .Marland Kitchen.. 1 by mouth two times a day 5)  Amlodipine Besylate 10 Mg Tabs (Amlodipine besylate) .Marland Kitchen.. 1 daily for blood pressure 6)  Ibuprofen 800 Mg Tabs (Ibuprofen) .Marland Kitchen.. 1 three times a day as needed with food 7)  Hydrochlorothiazide 25 Mg Tabs (Hydrochlorothiazide) .Marland Kitchen.. 1 daily  Patient Instructions: 1)  Come back in 1 month to check your blood pressure 2)  Come in for blood test in 10-14 days 3)  check your blood pressure and bring in the readings and bring in all your medicines Prescriptions: HYDROCHLOROTHIAZIDE  25 MG TABS (HYDROCHLOROTHIAZIDE) 1 daily  #30 x 3   Entered and Authorized by:   Pearlean Brownie MD   Signed by:   Pearlean Brownie MD on 09/28/2010   Method used:   Electronically to        [facility] [phone]* (retail)       [address]       [place], [place]  [address]       Ph: [phone]       Fax: [phone]   RxID:   [id number]    Orders Added: 1)  [facility]- Est  Level 4 [[address]]

## 2010-11-09 ENCOUNTER — Other Ambulatory Visit: Payer: Self-pay | Admitting: Family Medicine

## 2010-11-09 DIAGNOSIS — I10 Essential (primary) hypertension: Secondary | ICD-10-CM

## 2010-11-09 NOTE — Telephone Encounter (Signed)
Refill request

## 2010-11-29 LAB — BASIC METABOLIC PANEL
Chloride: 103 mEq/L (ref 96–112)
GFR calc Af Amer: >60 mL/min (ref >60)
Potassium: 3.3 mEq/L — ABNORMAL LOW (ref 3.5–5.1)

## 2010-11-29 LAB — CBC
HCT: 40 % (ref 39.0–52.0)
Hemoglobin: 14.2 g/dL (ref 13.0–17.0)
MCV: 88.9 fL (ref 78.0–100.0)
RBC: 4.5 MIL/uL (ref 4.22–5.81)
WBC: 9.4 10*3/uL (ref 4.0–10.5)

## 2010-11-29 LAB — CULTURE, BLOOD (ROUTINE X 2): Report Status: 7262010

## 2010-11-29 LAB — DIFFERENTIAL
Eosinophils Absolute: 0.2 10*3/uL (ref 0.0–0.7)
Eosinophils Relative: 2 % (ref 0–5)
Lymphs Abs: 1.8 10*3/uL (ref 0.7–4.0)
Monocytes Relative: 8 % (ref 3–12)

## 2011-01-15 ENCOUNTER — Other Ambulatory Visit (INDEPENDENT_AMBULATORY_CARE_PROVIDER_SITE_OTHER): Payer: PRIVATE HEALTH INSURANCE | Admitting: Family Medicine

## 2011-01-15 DIAGNOSIS — I10 Essential (primary) hypertension: Secondary | ICD-10-CM

## 2011-01-15 NOTE — Telephone Encounter (Signed)
Refill request

## 2011-02-19 ENCOUNTER — Ambulatory Visit: Payer: PRIVATE HEALTH INSURANCE | Admitting: Family Medicine

## 2011-03-01 ENCOUNTER — Ambulatory Visit: Payer: PRIVATE HEALTH INSURANCE | Admitting: Family Medicine

## 2011-03-03 ENCOUNTER — Ambulatory Visit (INDEPENDENT_AMBULATORY_CARE_PROVIDER_SITE_OTHER): Payer: PRIVATE HEALTH INSURANCE | Admitting: Family Medicine

## 2011-03-03 ENCOUNTER — Encounter: Payer: Self-pay | Admitting: Family Medicine

## 2011-03-03 VITALS — BP 188/113 | HR 81 | Temp 98.3°F | Wt 183.0 lb

## 2011-03-03 DIAGNOSIS — G809 Cerebral palsy, unspecified: Secondary | ICD-10-CM

## 2011-03-03 DIAGNOSIS — I10 Essential (primary) hypertension: Secondary | ICD-10-CM

## 2011-03-03 DIAGNOSIS — R21 Rash and other nonspecific skin eruption: Secondary | ICD-10-CM

## 2011-03-03 MED ORDER — HYDROCHLOROTHIAZIDE 25 MG PO TABS: 25.0000 mg | ORAL_TABLET | Freq: Every day | ORAL | Status: DC

## 2011-03-03 MED ORDER — LISINOPRIL 40 MG PO TABS: 40.0000 mg | ORAL_TABLET | Freq: Every day | ORAL | Status: DC

## 2011-03-03 MED ORDER — HYDROCORTISONE 2.5 % EX OINT: TOPICAL_OINTMENT | Freq: Two times a day (BID) | CUTANEOUS | Status: DC

## 2011-03-03 MED ORDER — AMLODIPINE BESYLATE 10 MG PO TABS: 10.0000 mg | ORAL_TABLET | Freq: Every day | ORAL | Status: DC

## 2011-03-03 NOTE — Assessment & Plan Note (Signed)
Lab Results  Component Value Date   NA 142 07/01/2010   K 4.3 07/01/2010   CL 104 07/01/2010   CO2 30 07/01/2010   BUN 9 07/01/2010   CREATININE 0.95 07/01/2010    BP Readings from Last 3 Encounters:  03/03/11 188/113  09/28/10 151/94  08/10/10 163/93    Assessment: Hypertension control:  moderately elevated  Progress toward goals:  deteriorated Barriers to meeting goals:  nonadherence to medications  Plan: Hypertension treatment:  continue current medications and monitor

## 2011-03-03 NOTE — Progress Notes (Signed)
  Subjective:    Patient ID: Melvin Nguyen., male    DOB: 07-15-1966, 45 y.o.   MRN: [id number]  HPI  Rash On both wrists, itchy and red.  No known bites or exposures.  No other rashes or oral lesions.  Feels well over all  HYPERTENSION Disease Monitoring Blood pressure range-Was 160/90s on recent doctor visit.  Does not home monitor Chest pain- no      Dyspnea- no Medications Compliance- ran out of lisinopril  Lightheadedness- no   Edema- no  Cerebral Palsy Causes him to fall if he walks long distances (> 500 yards about)  He has a scooter but is not operable.  He would use it to move around outside to see friends.  He uses a cane while inside.   No recent change in weakness or gait  Review of Symptoms - see HPI  PMH - Smoking status noted.  He would like to quit    Review of Systems     Objective:   Physical Exam    Heart - Regular rate and rhythm.  No murmurs, gallops or rubs.    Lungs:  Normal respiratory effort, chest expands symmetrically. Lungs are clear to auscultation, no crackles or wheezes. Skin:  Intact scattered red papules on inner wrist bilaterally.   Oral mucosa - normal Neurological  - he walks with a widebased asymetric gait using a cane heavily     Assessment & Plan:

## 2011-03-03 NOTE — Patient Instructions (Signed)
Bring all your med bottles next visit  Come back in 2 weeks  Check your blood pressure and write down when you have a chance  Think about stopping smoking

## 2011-03-03 NOTE — Assessment & Plan Note (Signed)
His limitations should qualify him for a motorized wheelchair or scooter

## 2011-03-03 NOTE — Assessment & Plan Note (Signed)
New.  Most consistent with exposure contact or bites.  No systemic signs.  Treat with [facility]

## 2011-03-22 ENCOUNTER — Encounter: Payer: Self-pay | Admitting: Family Medicine

## 2011-03-22 ENCOUNTER — Ambulatory Visit (INDEPENDENT_AMBULATORY_CARE_PROVIDER_SITE_OTHER): Payer: PRIVATE HEALTH INSURANCE | Admitting: Family Medicine

## 2011-03-22 DIAGNOSIS — G4730 Sleep apnea, unspecified: Secondary | ICD-10-CM

## 2011-03-22 DIAGNOSIS — E039 Hypothyroidism, unspecified: Secondary | ICD-10-CM

## 2011-03-22 DIAGNOSIS — Z13220 Encounter for screening for lipoid disorders: Secondary | ICD-10-CM

## 2011-03-22 DIAGNOSIS — I10 Essential (primary) hypertension: Secondary | ICD-10-CM

## 2011-03-22 LAB — LIPID PANEL
Cholesterol: 223 mg/dL — ABNORMAL HIGH (ref 0–200)
LDL Cholesterol: 157 mg/dL — ABNORMAL HIGH (ref 0–99)
VLDL: 29 mg/dL (ref 0–40)

## 2011-03-22 LAB — COMPREHENSIVE METABOLIC PANEL
ALT: 16 U/L (ref 0–53)
CO2: 29 mEq/L (ref 19–32)
Sodium: 137 mEq/L (ref 135–145)
Total Bilirubin: 0.6 mg/dL (ref 0.3–1.2)
Total Protein: 7.1 g/dL (ref 6.0–8.3)

## 2011-03-22 LAB — TSH: TSH: 7.433 u[IU]/mL — ABNORMAL HIGH (ref 0.350–4.500)

## 2011-03-22 NOTE — Assessment & Plan Note (Signed)
Lab Results  Component Value Date   NA 142 07/01/2010   K 4.3 07/01/2010   CL 104 07/01/2010   CO2 30 07/01/2010   BUN 9 07/01/2010   CREATININE 0.95 07/01/2010    BP Readings from Last 3 Encounters:  03/22/11 128/88  03/03/11 188/113  09/28/10 151/94    Assessment: Hypertension control:  controlled  Progress toward goals:  at goal Barriers to meeting goals:  transportation problems  Plan: Hypertension treatment:  continue current medications

## 2011-03-22 NOTE — Assessment & Plan Note (Signed)
Currently doing well with home CPAP

## 2011-03-22 NOTE — Progress Notes (Signed)
  Subjective:    Patient ID: Melvin Nguyen., male    DOB: 08-Jul-1966, 45 y.o.   MRN: [id number]  HPI  HYPERTENSION Disease Monitoring Home BP Monitoring not doing Chest pain- no     Dyspnea-  no  Medications Compliance: taking as prescribed. Lightheadedness-  no  Edema-  no   ROS - See HPI  PMH Cardiovascular risk factors: hypertension, male gender and smoking/ tobacco exposure  HYPOTHYROIDISM Disease Monitoring Weight changes: no  Skin Changes: no Palpitations: no Heat/Cold intolerance: no  Medication Monitoring Compliance:  Good takes daily   Last TSH:   Lab Results  Component Value Date   TSH 2.374 09/25/2009     Review of Symptoms - see HPI  PMH - Smoking status noted.     Review of Systems     Objective:   Physical Exam  No acute distress       Assessment & Plan:   No problem-specific assessment & plan notes found for this encounter.

## 2011-03-22 NOTE — Patient Instructions (Signed)
Come back in 3-6 months  Check your blood pressure if > 150/90 regularly then come in You goal is < 140/90  I will call you if your tests are not good.  Otherwise I will send you a letter.  If you do not hear from me with in 2 weeks please call our office.     Stop smoking is the number 1 thing you could do for your health

## 2011-03-22 NOTE — Assessment & Plan Note (Signed)
No symptoms - check TSH

## 2011-03-25 ENCOUNTER — Encounter: Payer: Self-pay | Admitting: Family Medicine

## 2011-03-26 ENCOUNTER — Encounter: Payer: Self-pay | Admitting: Family Medicine

## 2011-04-27 ENCOUNTER — Telehealth: Payer: Self-pay | Admitting: Family Medicine

## 2011-04-27 NOTE — Telephone Encounter (Signed)
To MD for follow-up Melvin Nguyen, Melvin Nguyen

## 2011-04-27 NOTE — Telephone Encounter (Signed)
Mailed a 3 part CMN on 8/14 for Dr. Deirdre Priest to sign off on and she does not have record of receiving it back.  The last call she placed she was told that Dr. Deirdre Priest was reviewing it and she wondered if he had any questions.

## 2011-07-25 ENCOUNTER — Other Ambulatory Visit: Payer: Self-pay | Admitting: Family Medicine

## 2011-07-25 NOTE — Telephone Encounter (Signed)
Refill request

## 2011-08-02 ENCOUNTER — Ambulatory Visit (INDEPENDENT_AMBULATORY_CARE_PROVIDER_SITE_OTHER): Payer: PRIVATE HEALTH INSURANCE | Admitting: Family Medicine

## 2011-08-02 ENCOUNTER — Encounter: Payer: Self-pay | Admitting: Family Medicine

## 2011-08-02 VITALS — BP 148/111 | HR 58 | Temp 98.1°F | Ht 64.5 in | Wt 190.0 lb

## 2011-08-02 DIAGNOSIS — Z23 Encounter for immunization: Secondary | ICD-10-CM

## 2011-08-02 DIAGNOSIS — I10 Essential (primary) hypertension: Secondary | ICD-10-CM

## 2011-08-02 DIAGNOSIS — K089 Disorder of teeth and supporting structures, unspecified: Secondary | ICD-10-CM

## 2011-08-02 DIAGNOSIS — K0889 Other specified disorders of teeth and supporting structures: Secondary | ICD-10-CM

## 2011-08-02 DIAGNOSIS — E039 Hypothyroidism, unspecified: Secondary | ICD-10-CM

## 2011-08-02 MED ORDER — CEPHALEXIN 250 MG PO CAPS: 250.0000 mg | ORAL_CAPSULE | Freq: Four times a day (QID) | ORAL | Status: DC

## 2011-08-02 MED ORDER — LEVOTHYROXINE SODIUM 75 MCG PO TABS
75.0000 ug | ORAL_TABLET | Freq: Every day | ORAL | Status: DC
Start: 2011-08-02 — End: 2011-10-07

## 2011-08-02 NOTE — Progress Notes (Signed)
  Subjective:    Patient ID: Melvin Nguyen., male    DOB: 1966/05/08, 45 y.o.   MRN: [id number]  HPI  Tooth Pain Bilateral left > right eye teeth pain for several weeks,  Swelling comes and goes.  No fever.  Took some old antibiotics which helped.  No fever or nausea or vomiting or face redness or swelling  HYPERTENSION Disease Monitoring Home BP Monitoring ranges from 140s/90s to 190s/100s Chest pain- no     Dyspnea-  yes  Medications Compliance: relates taking all his medications but last took them > 24 hours ago . Lightheadedness-  no  Edema-  no   ROS - See HPI  PMH Lab Review   Potassium  Date Value Range Status  03/22/2011 3.3* 3.5-5.3 (mEq/L) Final     Sodium  Date Value Range Status  03/22/2011 137  135-145 (mEq/L) Final       Review of Symptoms - see HPI  PMH - Smoking status noted.        Review of Systems     Objective:   Physical Exam  No acute distress Lungs:  Normal respiratory effort, chest expands symmetrically. Lungs are clear to auscultation, no crackles or wheezes. Heart - Regular rate and rhythm.  No murmurs, gallops or rubs.    Extremities:  No cyanosis, edema, or deformity noted with good range of motion of all major joints.   Eye - Pupils Equal Round Reactive to light, Extraocular movements intact, Fundi without hemorrhage or visible lesions, Conjunctiva without redness or discharge Teeth - poor dentition diffusely tender diffusely over mandibular lateral teeth no focal soft tissue swelling       Assessment & Plan:

## 2011-08-02 NOTE — Patient Instructions (Signed)
Your blood pressure is dangerously high you could have a stroke or heart attack  Check it whenever you can and write down the amount and the time of day  Make sure you take all your medications the same time every day  BRING IN ALL YOUR MEDICATION BOTTLES NEXT VISIT

## 2011-08-02 NOTE — Assessment & Plan Note (Signed)
Likely root damage in several teeth.  Treatment of short course of antibiotics and gave list of dentists that take medicaid

## 2011-08-02 NOTE — Assessment & Plan Note (Signed)
Poorly controlled likely due to being > 24 hours since last dose of medications and his tooth pain.  No focal CNS signs.  Cautioned to take medications regularly and recheck in 1-2 weeks

## 2011-08-11 ENCOUNTER — Encounter: Payer: Self-pay | Admitting: Family Medicine

## 2011-08-11 ENCOUNTER — Ambulatory Visit (INDEPENDENT_AMBULATORY_CARE_PROVIDER_SITE_OTHER): Payer: PRIVATE HEALTH INSURANCE | Admitting: Family Medicine

## 2011-08-11 VITALS — BP 143/105 | HR 79 | Temp 98.0°F | Ht 64.5 in | Wt 184.0 lb

## 2011-08-11 DIAGNOSIS — F17200 Nicotine dependence, unspecified, uncomplicated: Secondary | ICD-10-CM

## 2011-08-11 DIAGNOSIS — I10 Essential (primary) hypertension: Secondary | ICD-10-CM

## 2011-08-11 DIAGNOSIS — E039 Hypothyroidism, unspecified: Secondary | ICD-10-CM

## 2011-08-11 LAB — COMPREHENSIVE METABOLIC PANEL
ALT: 17 U/L (ref 0–53)
AST: 19 U/L (ref 0–37)
Albumin: 4.4 g/dL (ref 3.5–5.2)
BUN: 11 mg/dL (ref 6–23)
Calcium: 9.6 mg/dL (ref 8.4–10.5)
Chloride: 98 mEq/L (ref 96–112)
Potassium: 3 mEq/L — ABNORMAL LOW (ref 3.5–5.3)
Sodium: 138 mEq/L (ref 135–145)
Total Protein: 7.1 g/dL (ref 6.0–8.3)

## 2011-08-11 MED ORDER — LISINOPRIL 40 MG PO TABS: 40.0000 mg | ORAL_TABLET | Freq: Every day | ORAL | Status: DC

## 2011-08-11 MED ORDER — OMEPRAZOLE 20 MG PO CPDR: 20.0000 mg | DELAYED_RELEASE_CAPSULE | Freq: Every day | ORAL | Status: DC

## 2011-08-11 MED ORDER — BACLOFEN 20 MG PO TABS: 20.0000 mg | ORAL_TABLET | Freq: Three times a day (TID) | ORAL | Status: DC

## 2011-08-11 MED ORDER — CARVEDILOL 12.5 MG PO TABS: 12.5000 mg | ORAL_TABLET | Freq: Two times a day (BID) | ORAL | Status: DC

## 2011-08-11 MED ORDER — HYDROCHLOROTHIAZIDE 25 MG PO TABS: 25.0000 mg | ORAL_TABLET | Freq: Every day | ORAL | Status: DC

## 2011-08-11 MED ORDER — AMLODIPINE BESYLATE 10 MG PO TABS: 10.0000 mg | ORAL_TABLET | Freq: Every day | ORAL | Status: DC

## 2011-08-11 NOTE — Assessment & Plan Note (Signed)
Not well controlled.  Will add Coreg and follow closely.  Check labs today

## 2011-08-11 NOTE — Assessment & Plan Note (Signed)
Encouraged him to pick up his new prescription dose

## 2011-08-11 NOTE — Progress Notes (Signed)
  Subjective:    Patient ID: Unice Bailey., male    DOB: 1966-07-10, 45 y.o.   MRN: [id number]  HPI  HYPERTENSION Disease Monitoring Home BP Monitoring not doing Chest pain- no     Dyspnea-  no  Medications Compliance: taking as prescribed.  Has all bottles Lightheadedness-  no  Edema-  no   ROS - See HPI  PMH Lab Review   Potassium  Date Value Range Status  03/22/2011 3.3* 3.5-5.3 (mEq/L) Final     Sodium  Date Value Range Status  03/22/2011 137  135-145 (mEq/L) Final      HYPOTHYROIDISM Disease Monitoring Weight changes: no  Skin Changes: no Palpitations: no Heat/Cold intolerance: no  Medication Monitoring Compliance:  Has not gotten his increased prescription dose   Last TSH:   Lab Results  Component Value Date   TSH 7.433* 03/22/2011    Tobacco Still smoking less than 10 per day.  Smokes because of stress.   Wonders if can have a pill to help.  Already on wellbutrin and has history of depression    Review of Systems     Objective:   Physical Exam Lungs:  Normal respiratory effort, chest expands symmetrically. Lungs are clear to auscultation, no crackles or wheezes. Heart - Regular rate and rhythm.  No murmurs, gallops or rubs.           Assessment & Plan:

## 2011-08-11 NOTE — Assessment & Plan Note (Signed)
Encouraged to find substitute.  He is already on Wellbutrin and given his psychiatric history is not a good candidate for Chantix

## 2011-08-11 NOTE — Patient Instructions (Addendum)
Try to check your blood pressure your goal is < 140/90.  Come back in 2 month for a recheck  Get the correct dose of thyroid medication and take it daily  I will call you if your tests are not good.  Otherwise I will send you a letter.  If you do not hear from me with in 2 weeks please call our office.     Finding a substitute for cigarettes is the key to stop smoking   I hope things improve with your wife

## 2011-08-12 ENCOUNTER — Encounter: Payer: Self-pay | Admitting: Family Medicine

## 2011-09-10 ENCOUNTER — Ambulatory Visit (INDEPENDENT_AMBULATORY_CARE_PROVIDER_SITE_OTHER): Payer: PRIVATE HEALTH INSURANCE | Admitting: Family Medicine

## 2011-09-10 ENCOUNTER — Other Ambulatory Visit: Payer: Self-pay

## 2011-09-10 ENCOUNTER — Ambulatory Visit (HOSPITAL_COMMUNITY)
Admission: RE | Admit: 2011-09-10 | Discharge: 2011-09-10 | Disposition: A | Discharge status: Home / Self Care | Payer: PRIVATE HEALTH INSURANCE | Source: Ambulatory Visit | Attending: Family Medicine | Admitting: Family Medicine

## 2011-09-10 ENCOUNTER — Encounter: Payer: Self-pay | Admitting: Family Medicine

## 2011-09-10 DIAGNOSIS — R0789 Other chest pain: Secondary | ICD-10-CM

## 2011-09-10 DIAGNOSIS — E669 Obesity, unspecified: Secondary | ICD-10-CM

## 2011-09-10 DIAGNOSIS — R079 Chest pain, unspecified: Secondary | ICD-10-CM

## 2011-09-10 MED ORDER — OMEPRAZOLE 20 MG PO CPDR: 40.0000 mg | DELAYED_RELEASE_CAPSULE | Freq: Every day | ORAL | Status: DC

## 2011-09-10 MED ORDER — MELOXICAM 7.5 MG PO TABS: 7.5000 mg | ORAL_TABLET | Freq: Every day | ORAL | Status: DC

## 2011-09-10 NOTE — Patient Instructions (Signed)
Mr. Rufo,  Thank you for coming in to see me today.  I do not think your chest pain is your heart, but more chest wall pain and reflux.   Reflux: increase omeprazole to 40 mg daily for the next week.  Chest wall pain: take mobic as needed.   Warning signs to call/be evaluated sooner: pain brought on with exercise (walking up stairs, sex, etc., pain not better with rest, pain more severe, feeling lightheaded/dizzy with pain. Best to take an aspirin and call for help.   F/u with Dr. Deirdre Priest next week.   Dr. Armen Pickup

## 2011-09-13 ENCOUNTER — Encounter: Payer: Self-pay | Admitting: Family Medicine

## 2011-09-13 DIAGNOSIS — E669 Obesity, unspecified: Secondary | ICD-10-CM | POA: Insufficient documentation

## 2011-09-13 HISTORY — DX: Obesity, unspecified: E66.9

## 2011-09-13 NOTE — Progress Notes (Signed)
Patient ID: Melvin Bailey., male   DOB: 1966-01-08, 46 y.o.   MRN: [id number] Subjective:    Melvin Grosso. is a 46 y.o. male who presents for evaluation of chest pain. Onset was 14 hours ago.  He was in bed falling asleep when the symptoms started. Symptoms have improved since that time. The patient describes the pain as pressure and radiates to the left arm. Patient rates pain as a 6/10 in intensity, now 4/10. Associated symptoms are: non-productive cough and shortness of breath.. Aggravating factors are: none. Alleviating factors are: none. Patient's cardiac risk factors are: hypertension, obesity (BMI >= 30 kg/m2) and smoking/ tobacco exposure. Patient's risk factors for DVT/PE: none. Previous cardiac testing: none.  Review of Systems Pertinent items are noted in HPI.    Objective:    BP 129/72  Pulse 65  Temp(Src) 97.6 F (36.4 C) (Oral)  Ht 5' 4.5" (1.638 m)  Wt 183 lb (83.008 kg)  BMI 30.93 kg/m2 General appearance: alert, cooperative and no distress Head: Normocephalic, without obvious abnormality, atraumatic Neck: no adenopathy, no carotid bruit, no JVD and supple, symmetrical, trachea midline Lungs: normal work of breathing. Clear except for scattered exp wheenzing R>L.  Chest wall: substernal tenderness to palpation (same pain as last night).  Heart: regular rate and rhythm, S1, S2 normal, no murmur, click, rub or gallop Abdomen: soft, non-tender; bowel sounds normal; no masses,  no organomegaly Extremities: extremities normal, atraumatic, no cyanosis or edema Skin: Skin color, texture, turgor normal. No rashes or lesions  Cardiographics ECG: NSR, Rate 56, no ST elevation or depression. Normal axis. normal QTc of 424.   Assessment:    Chest pain, suspected etiology: chest wall pain and GERD    Plan:    Patient history and exam consistent with non-cardiac cause of chest pain. Preceoted with Dr. Deirdre Priest, pt's PCP.  Prescription NSAIDs per medication  orders. Worsening signs and symptoms discussed and patient verbalized understanding. F/u with PCP next week.

## 2011-09-13 NOTE — Assessment & Plan Note (Addendum)
A: Patient history and exam consistent with non-cardiac cause of chest pain. Suspected etiology: chest wall pain and GERD. Precepted with Dr. Deirdre Priest, pt's PCP.  P:  Prescription NSAIDs per medication orders, mobic. Increase PPI to 40 mg q D for next week. Worsening signs and symptoms discussed and patient verbalized understanding. F/u with PCP next week.

## 2011-09-20 ENCOUNTER — Ambulatory Visit: Payer: PRIVATE HEALTH INSURANCE | Admitting: Family Medicine

## 2011-09-22 ENCOUNTER — Encounter: Payer: Self-pay | Admitting: Family Medicine

## 2011-09-22 ENCOUNTER — Ambulatory Visit (INDEPENDENT_AMBULATORY_CARE_PROVIDER_SITE_OTHER): Payer: PRIVATE HEALTH INSURANCE | Admitting: Family Medicine

## 2011-09-22 VITALS — BP 132/78 | HR 66 | Temp 98.1°F | Ht 64.0 in | Wt 181.0 lb

## 2011-09-22 DIAGNOSIS — E039 Hypothyroidism, unspecified: Secondary | ICD-10-CM

## 2011-09-22 DIAGNOSIS — R0789 Other chest pain: Secondary | ICD-10-CM

## 2011-09-22 DIAGNOSIS — Z23 Encounter for immunization: Secondary | ICD-10-CM

## 2011-09-22 DIAGNOSIS — F17200 Nicotine dependence, unspecified, uncomplicated: Secondary | ICD-10-CM

## 2011-09-22 DIAGNOSIS — I10 Essential (primary) hypertension: Secondary | ICD-10-CM

## 2011-09-22 LAB — BASIC METABOLIC PANEL
BUN: 11 mg/dL (ref 6–23)
Calcium: 9 mg/dL (ref 8.4–10.5)
Creat: 0.96 mg/dL (ref 0.50–1.35)

## 2011-09-22 NOTE — Assessment & Plan Note (Signed)
Prior TSH too high.  Will recheck clinically euthyroid

## 2011-09-22 NOTE — Assessment & Plan Note (Signed)
Resolved with PPI.

## 2011-09-22 NOTE — Progress Notes (Signed)
  Subjective:    Patient ID: Melvin Nguyen., male    DOB: [birth year], 46 y.o.   MRN: [id number]  HPI  Chest Pain None since increased PPI.  No shortness of breath or leg swelling or GI bleeding  HYPOTHYROIDISM Disease Monitoring Weight changes: slowly losing by working on his diet  Skin Changes: no Palpitations: no Heat/Cold intolerance: no  Medication Monitoring Compliance:  Daily levothyroid   Last TSH:   Lab Results  Component Value Date   TSH 7.433* 03/22/2011   HYPERTENSION Disease Monitoring Home BP Monitoring not doing Chest pain- no     Dyspnea-  no  Medications Compliance: taking as prescribed. Lightheadedness-  no  Edema-  no   ROS - See HPI  PMH Lab Review   Potassium  Date Value Range Status  08/11/2011 3.0* 3.5-5.3 (mEq/L) Final     Sodium  Date Value Range Status  08/11/2011 138  135-145 (mEq/L) Final     Tobacco Would like to stop.  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  Down to 1/2 pack.      Review of Symptoms - see HPI  PMH - Smoking status noted.        Review of Systems     Objective:   Physical Exam        Assessment & Plan:

## 2011-09-22 NOTE — Assessment & Plan Note (Signed)
Well controlled check bmet to follow up on low K

## 2011-09-22 NOTE — Assessment & Plan Note (Signed)
Would like to stop.  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  Down to 1/2 pack. .  Discussed approaches.  Decided not a good Chantix candidate.  Will work on finding substitute

## 2011-09-22 NOTE — Patient Instructions (Signed)
Your biggest health challenge is to stop smoking.   Find a substitute for the cigarettes  I will call you if your tests are not good.  Otherwise I will send you a letter.  If you do not hear from me with in 2 weeks please call our office.     Keep working on the weight and low fat diet - will check cholestrol in 6 months  Come back in 6 months

## 2011-09-24 ENCOUNTER — Encounter: Payer: Self-pay | Admitting: Family Medicine

## 2011-10-06 ENCOUNTER — Telehealth: Payer: Self-pay | Admitting: Family Medicine

## 2011-10-06 DIAGNOSIS — E039 Hypothyroidism, unspecified: Secondary | ICD-10-CM

## 2011-10-06 NOTE — Telephone Encounter (Signed)
Was told by his wife that Dr Deirdre Priest wanted him to call to talk to him.

## 2011-10-07 MED ORDER — LEVOTHYROXINE SODIUM 100 MCG PO TABS: 100.0000 ug | ORAL_TABLET | Freq: Every day | ORAL | Status: DC

## 2011-10-07 NOTE — Telephone Encounter (Signed)
Called told ot pick up new RX

## 2011-11-08 ENCOUNTER — Encounter: Payer: Self-pay | Admitting: Home Health Services

## 2011-11-16 ENCOUNTER — Other Ambulatory Visit (HOSPITAL_COMMUNITY)
Admission: RE | Admit: 2011-11-16 | Discharge: 2011-11-16 | Disposition: A | Discharge status: Home / Self Care | Payer: PRIVATE HEALTH INSURANCE | Source: Ambulatory Visit | Attending: Family Medicine | Admitting: Family Medicine

## 2011-11-16 ENCOUNTER — Ambulatory Visit (INDEPENDENT_AMBULATORY_CARE_PROVIDER_SITE_OTHER): Payer: PRIVATE HEALTH INSURANCE | Admitting: Family Medicine

## 2011-11-16 ENCOUNTER — Encounter: Payer: Self-pay | Admitting: Family Medicine

## 2011-11-16 VITALS — BP 130/86 | HR 70 | Temp 98.4°F | Ht 64.0 in | Wt 182.0 lb

## 2011-11-16 DIAGNOSIS — Z202 Contact with and (suspected) exposure to infections with a predominantly sexual mode of transmission: Secondary | ICD-10-CM

## 2011-11-16 DIAGNOSIS — Z113 Encounter for screening for infections with a predominantly sexual mode of transmission: Secondary | ICD-10-CM | POA: Insufficient documentation

## 2011-11-16 MED ORDER — METRONIDAZOLE 500 MG PO TABS: 2000.0000 mg | ORAL_TABLET | Freq: Once | ORAL | Status: AC

## 2011-11-16 NOTE — Assessment & Plan Note (Signed)
Will treat for Trichomonas with metronidazole 2 g once as this is what his wife had. The exact diagnosis was not discussed with the patient. Recommended that he discussed this with his wife. We'll also check for gonorrhea/ Chlamydia, HIV and hepatitis.

## 2011-11-16 NOTE — Patient Instructions (Addendum)
Take metronidazole 4 tablets at same time Someone will with the results of your lab testing.

## 2011-11-16 NOTE — Progress Notes (Signed)
Addended by: [place], Liviah Cake on: 11/16/2011 12:35 PM   Modules accepted: Orders

## 2011-11-16 NOTE — Progress Notes (Signed)
  Subjective:    Patient ID: Melvin Nguyen., male    DOB: September 02, 1965, 46 y.o.   MRN: [id number]  HPI 46 year old male who presents with concerns of STDs. His wife was seen 1-2 days ago and received a prescription of metronidazole with instructions not to have sex for 7 days. The patient is unsure of what his wife is being treated for, but suspects that is why does not in a monogamous relationship with him. He would like to be tested for STDs. He is asymptomatic currently.   Review of Systems  Constitutional: Negative for fever and fatigue.  Genitourinary: Negative for dysuria, hematuria, discharge, genital sores and penile pain.       Objective:   Physical Exam  Constitutional: He is oriented to person, place, and time. He appears well-developed and well-nourished.  Neurological: He is alert and oriented to person, place, and time.  Psychiatric: He has a normal mood and affect. His behavior is normal. Judgment and thought content normal.      Assessment & Plan:

## 2011-11-17 LAB — HEPATITIS C ANTIBODY: HCV Ab: NEGATIVE

## 2011-11-17 LAB — HEPATITIS B SURFACE ANTIGEN: Hepatitis B Surface Ag: NEGATIVE

## 2011-11-25 ENCOUNTER — Other Ambulatory Visit: Payer: Self-pay | Admitting: Family Medicine

## 2011-11-25 DIAGNOSIS — I10 Essential (primary) hypertension: Secondary | ICD-10-CM

## 2011-11-29 ENCOUNTER — Ambulatory Visit (INDEPENDENT_AMBULATORY_CARE_PROVIDER_SITE_OTHER): Payer: PRIVATE HEALTH INSURANCE | Admitting: Family Medicine

## 2011-11-29 ENCOUNTER — Encounter: Payer: Self-pay | Admitting: Family Medicine

## 2011-11-29 VITALS — BP 143/81 | HR 77 | Temp 98.3°F | Ht 63.0 in | Wt 182.0 lb

## 2011-11-29 DIAGNOSIS — M549 Dorsalgia, unspecified: Secondary | ICD-10-CM | POA: Insufficient documentation

## 2011-11-29 DIAGNOSIS — E039 Hypothyroidism, unspecified: Secondary | ICD-10-CM

## 2011-11-29 MED ORDER — CYCLOBENZAPRINE HCL 10 MG PO TABS: 10.0000 mg | ORAL_TABLET | Freq: Two times a day (BID) | ORAL | Status: AC | PRN

## 2011-11-29 MED ORDER — IBUPROFEN 600 MG PO TABS: 600.0000 mg | ORAL_TABLET | Freq: Three times a day (TID) | ORAL | Status: AC | PRN

## 2011-11-29 NOTE — Progress Notes (Signed)
  Subjective:    Patient ID: Melvin Nguyen., male    DOB: 07/25/1966, 46 y.o.   MRN: [id number]  HPI BACK PAIN For last 2 weeks started when walking a dog who pulledn and he felt pain on R side.   No radiation no weakness or incontinence or fever or hematuria.  Has had similar in the past and muscle relaxer helped.   Has not taken any medications other than usual hydrocodone from pain center  HYPOTHYROID takig increased dose of levothyroid > 2 months.  No weight changes or heat tolerance changes  Review of Symptoms - see HPI  PMH - Smoking status noted.      Review of Systems     Objective:   Physical Exam  Back - tender R mid lumbar paraspinous muscles. No CVAT. No deformity noted + spasm SLR - normal Able to stand on heels and toes without weakness       Assessment & Plan:

## 2011-11-29 NOTE — Assessment & Plan Note (Signed)
Check TSH today to see if is a better dose

## 2011-11-29 NOTE — Patient Instructions (Signed)
Use the flexaril and ibuprofen as needed for pain also heat will help  Your pain should be better in 1-2 weeks and gone in 2 months  If you are not getting better or if you have any fever or weakness or blood in your urine then call us  I will send you a letter about your thyroid test

## 2011-11-29 NOTE — Assessment & Plan Note (Signed)
Most consistent with musculoskeletal  Pain. No signs of kidney stone or focal lesion of spine.  Treat with muscle relaxer and follow for resolution

## 2011-11-30 ENCOUNTER — Encounter: Payer: Self-pay | Admitting: Family Medicine

## 2012-01-20 ENCOUNTER — Other Ambulatory Visit: Payer: Self-pay | Admitting: Family Medicine

## 2012-02-03 ENCOUNTER — Other Ambulatory Visit: Payer: Self-pay | Admitting: Family Medicine

## 2012-02-03 NOTE — Telephone Encounter (Signed)
Refill request forwarded to me in Dr Deirdre Priest' absence. I see that cyclobenzaprine was discontinued on 12/09/11, therefore will "deny". Paula Compton, MD

## 2012-04-26 ENCOUNTER — Ambulatory Visit: Payer: PRIVATE HEALTH INSURANCE | Admitting: Family Medicine

## 2012-05-03 ENCOUNTER — Encounter: Payer: Self-pay | Admitting: Family Medicine

## 2012-05-03 ENCOUNTER — Ambulatory Visit (INDEPENDENT_AMBULATORY_CARE_PROVIDER_SITE_OTHER): Payer: PRIVATE HEALTH INSURANCE | Admitting: Family Medicine

## 2012-05-03 VITALS — BP 149/93 | HR 70 | Ht 63.0 in | Wt 190.0 lb

## 2012-05-03 DIAGNOSIS — E039 Hypothyroidism, unspecified: Secondary | ICD-10-CM

## 2012-05-03 DIAGNOSIS — I10 Essential (primary) hypertension: Secondary | ICD-10-CM

## 2012-05-03 DIAGNOSIS — G809 Cerebral palsy, unspecified: Secondary | ICD-10-CM

## 2012-05-03 LAB — COMPREHENSIVE METABOLIC PANEL
Alkaline Phosphatase: 49 U/L (ref 39–117)
Glucose, Bld: 86 mg/dL (ref 70–99)
Sodium: 138 mEq/L (ref 135–145)
Total Bilirubin: 0.7 mg/dL (ref 0.3–1.2)
Total Protein: 6.9 g/dL (ref 6.0–8.3)

## 2012-05-03 LAB — CBC
Hemoglobin: 15.1 g/dL (ref 13.0–17.0)
RBC: 5.14 MIL/uL (ref 4.22–5.81)

## 2012-05-03 MED ORDER — HYDROCHLOROTHIAZIDE 25 MG PO TABS: 25.0000 mg | ORAL_TABLET | Freq: Every day | ORAL | Status: DC

## 2012-05-03 MED ORDER — LEVOTHYROXINE SODIUM 100 MCG PO TABS: 100.0000 ug | ORAL_TABLET | Freq: Every day | ORAL | Status: DC

## 2012-05-03 MED ORDER — CARVEDILOL 12.5 MG PO TABS: 12.5000 mg | ORAL_TABLET | Freq: Two times a day (BID) | ORAL | Status: DC

## 2012-05-03 MED ORDER — LISINOPRIL 40 MG PO TABS: 40.0000 mg | ORAL_TABLET | Freq: Every day | ORAL | Status: DC

## 2012-05-03 MED ORDER — BACLOFEN 20 MG PO TABS: 20.0000 mg | ORAL_TABLET | Freq: Three times a day (TID) | ORAL | Status: DC

## 2012-05-03 MED ORDER — AMLODIPINE BESYLATE 10 MG PO TABS: 10.0000 mg | ORAL_TABLET | Freq: Every day | ORAL | Status: DC

## 2012-05-03 NOTE — Assessment & Plan Note (Signed)
Not well controlled likely due to being off coreg.  Will restart and follow up to ensure is responding

## 2012-05-03 NOTE — Assessment & Plan Note (Signed)
He feels is worsening and causing more gait abnormalities.  Will refer to neurology to help with diagnosis and therapy

## 2012-05-03 NOTE — Patient Instructions (Addendum)
Come back in 1 month to check your blood pressure  We will set up a referal to neurology for the CP  I will call you if your lab tests are not normal.  Otherwise we will discuss them at your next visit.  BRING ALL YOUR MEDICATION BOTTLES WHEN YOU COME

## 2012-05-03 NOTE — Progress Notes (Signed)
  Subjective:    Patient ID: Melvin Nguyen., male    DOB: [birth year], 46 y.o.   MRN: [id number]  HPI  HYPERTENSION Disease Monitoring Home BP Monitoring not doing Chest pain- no     Dyspnea-  no  Medications Compliance: relates taking all his medications but did not remember Corge.  Did not bring in bottles. Lightheadedness-  no  Edema-  no   ROS - See HPI  PMH Lab Review   Potassium  Date Value Range Status  09/22/2011 3.7  3.5 - 5.3 mEq/L Final     Sodium  Date Value Range Status  09/22/2011 140  135 - 145 mEq/L Final     Cerebral Palsy He feels he is having more trouble ambulating and keeping from falling.  Feels the baclofen is not helping with his limbs.  No focal weakness or vertigo or lightheadness Has not seen a neurologist for years.        Review of Systems     Objective:   Physical Exam  Alert oriented Heart - Regular rate and rhythm.  No murmurs, gallops or rubs.    Lungs:  Normal respiratory effort, chest expands symmetrically. Lungs are clear to auscultation, no crackles or wheezes. Neuro Able to move around rooms with limp but no difficulty Distal strength is normal bilaterally upper and lower Mild hypertonicity throughout without contractures Romberg - able to stand without breaking with eyes closed      Assessment & Plan:

## 2012-05-31 ENCOUNTER — Ambulatory Visit (INDEPENDENT_AMBULATORY_CARE_PROVIDER_SITE_OTHER): Payer: PRIVATE HEALTH INSURANCE | Admitting: Family Medicine

## 2012-05-31 ENCOUNTER — Encounter: Payer: Self-pay | Admitting: Family Medicine

## 2012-05-31 VITALS — BP 148/94 | HR 75 | Temp 98.5°F | Ht 63.0 in | Wt 193.0 lb

## 2012-05-31 DIAGNOSIS — E669 Obesity, unspecified: Secondary | ICD-10-CM

## 2012-05-31 DIAGNOSIS — I10 Essential (primary) hypertension: Secondary | ICD-10-CM

## 2012-05-31 DIAGNOSIS — Z23 Encounter for immunization: Secondary | ICD-10-CM

## 2012-05-31 DIAGNOSIS — G809 Cerebral palsy, unspecified: Secondary | ICD-10-CM

## 2012-05-31 NOTE — Assessment & Plan Note (Signed)
Encouraged him to monitor his weight

## 2012-05-31 NOTE — Patient Instructions (Addendum)
Check your blood pressure if it is regularly > 160/95 then call us  Come back in 6 months or as needed  Double check you are taking all the medication on your list

## 2012-05-31 NOTE — Progress Notes (Signed)
  Subjective:    Patient ID: Unice Bailey., male    DOB: [birth year], 46 y.o.   MRN: [id number]  HPI HYPERTENSION Disease Monitoring Home BP Monitoring better than is here but can't recall specifics Chest pain- no     Dyspnea-  no  Medications Compliance: Did not bring his medications but states he is taking all his medications daily except HCTZ which he takes about 4 days a week because he can't travel if he takes . Lightheadedness-  no  Edema-  no   ROS - See HPI  PMH Lab Review   Potassium  Date Value Range Status  05/03/2012 3.4* 3.5 - 5.3 mEq/L Final     Sodium  Date Value Range Status  05/03/2012 138  135 - 145 mEq/L Final     No personal history of coronary artery disease or renal disease or CHF       Review of Systems     Objective:   Physical Exam  No acute distress       Assessment & Plan:

## 2012-05-31 NOTE — Assessment & Plan Note (Signed)
Not at goal but is on 4 antihypertensives.  Since he does not have history of coronary artery disease or other strong indication for lowering further will continue with current medications and monitor

## 2012-06-06 ENCOUNTER — Telehealth: Payer: Self-pay | Admitting: Family Medicine

## 2012-06-06 NOTE — Telephone Encounter (Signed)
Needs orders for a CPAP mask and supplies pls fax to [phone]

## 2012-06-08 NOTE — Telephone Encounter (Signed)
Not sure what they need.  Usually they fax me a form to sign - please ask them to do so  Thanks  LC

## 2012-06-08 NOTE — Telephone Encounter (Signed)
Rx faxed Adelena Desantiago Dawn  

## 2012-06-08 NOTE — Telephone Encounter (Signed)
Spoke with [facility].  They just need an Rx for his mask and supplies.  After they receive this then they fax back a form to be signed.    Dr. Deirdre Priest,  If you will write the rx, i will fax it over. [place], Maryjo Rochester

## 2012-09-21 ENCOUNTER — Other Ambulatory Visit: Payer: Self-pay | Admitting: Family Medicine

## 2012-10-18 ENCOUNTER — Ambulatory Visit: Payer: PRIVATE HEALTH INSURANCE | Admitting: Family Medicine

## 2012-11-11 ENCOUNTER — Other Ambulatory Visit: Payer: Self-pay | Admitting: Family Medicine

## 2013-01-05 ENCOUNTER — Other Ambulatory Visit: Payer: Self-pay | Admitting: Family Medicine

## 2013-01-17 ENCOUNTER — Other Ambulatory Visit: Payer: Self-pay | Admitting: Family Medicine

## 2013-01-17 ENCOUNTER — Other Ambulatory Visit: Payer: Self-pay | Admitting: *Deleted

## 2013-01-17 DIAGNOSIS — I10 Essential (primary) hypertension: Secondary | ICD-10-CM

## 2013-01-17 MED ORDER — LISINOPRIL 40 MG PO TABS: 40.0000 mg | ORAL_TABLET | Freq: Every day | ORAL | Status: DC

## 2013-01-17 MED ORDER — HYDROCHLOROTHIAZIDE 25 MG PO TABS: 25.0000 mg | ORAL_TABLET | Freq: Every day | ORAL | Status: DC

## 2013-01-17 MED ORDER — CARVEDILOL 12.5 MG PO TABS: 12.5000 mg | ORAL_TABLET | Freq: Two times a day (BID) | ORAL | Status: DC

## 2013-01-17 MED ORDER — BACLOFEN 20 MG PO TABS: 20.0000 mg | ORAL_TABLET | Freq: Three times a day (TID) | ORAL | Status: DC

## 2013-01-17 NOTE — Telephone Encounter (Signed)
Requested Prescriptions   Pending Prescriptions Disp Refills  . baclofen (LIORESAL) 20 MG tablet 90 tablet 0  . lisinopril (PRINIVIL,ZESTRIL) 40 MG tablet 90 tablet 1    Sig: Take 1 tablet (40 mg total) by mouth daily.  . hydrochlorothiazide (HYDRODIURIL) 25 MG tablet 90 tablet 1    Sig: Take 1 tablet (25 mg total) by mouth daily.  . carvedilol (COREG) 12.5 MG tablet 180 tablet 0

## 2013-02-11 ENCOUNTER — Other Ambulatory Visit: Payer: Self-pay | Admitting: Family Medicine

## 2013-03-01 ENCOUNTER — Ambulatory Visit (HOSPITAL_COMMUNITY)
Admission: RE | Admit: 2013-03-01 | Discharge: 2013-03-01 | Disposition: A | Discharge status: Home / Self Care | Payer: PRIVATE HEALTH INSURANCE | Source: Ambulatory Visit | Attending: Pain Medicine | Admitting: Pain Medicine

## 2013-03-01 ENCOUNTER — Other Ambulatory Visit (HOSPITAL_COMMUNITY): Payer: Self-pay | Admitting: Pain Medicine

## 2013-03-01 DIAGNOSIS — M25519 Pain in unspecified shoulder: Secondary | ICD-10-CM | POA: Insufficient documentation

## 2013-03-01 DIAGNOSIS — M25512 Pain in left shoulder: Secondary | ICD-10-CM

## 2013-03-05 ENCOUNTER — Ambulatory Visit: Payer: PRIVATE HEALTH INSURANCE | Admitting: Family Medicine

## 2013-03-07 ENCOUNTER — Ambulatory Visit: Payer: PRIVATE HEALTH INSURANCE | Admitting: Family Medicine

## 2013-03-12 ENCOUNTER — Encounter: Payer: Self-pay | Admitting: Family Medicine

## 2013-03-12 ENCOUNTER — Ambulatory Visit (INDEPENDENT_AMBULATORY_CARE_PROVIDER_SITE_OTHER): Payer: PRIVATE HEALTH INSURANCE | Admitting: Family Medicine

## 2013-03-12 VITALS — BP 188/116 | HR 76 | Temp 98.3°F | Wt 180.0 lb

## 2013-03-12 DIAGNOSIS — I10 Essential (primary) hypertension: Secondary | ICD-10-CM

## 2013-03-12 DIAGNOSIS — R079 Chest pain, unspecified: Secondary | ICD-10-CM

## 2013-03-12 DIAGNOSIS — M549 Dorsalgia, unspecified: Secondary | ICD-10-CM

## 2013-03-12 DIAGNOSIS — E039 Hypothyroidism, unspecified: Secondary | ICD-10-CM

## 2013-03-12 MED ORDER — HYDROCHLOROTHIAZIDE 25 MG PO TABS: 25.0000 mg | ORAL_TABLET | Freq: Every day | ORAL | Status: DC

## 2013-03-12 MED ORDER — QUETIAPINE FUMARATE 50 MG PO TABS: 50.0000 mg | ORAL_TABLET | Freq: Every day | ORAL | Status: DC

## 2013-03-12 MED ORDER — CARVEDILOL 12.5 MG PO TABS: 12.5000 mg | ORAL_TABLET | Freq: Two times a day (BID) | ORAL | Status: DC

## 2013-03-12 MED ORDER — BACLOFEN 20 MG PO TABS: 20.0000 mg | ORAL_TABLET | Freq: Three times a day (TID) | ORAL | Status: DC

## 2013-03-12 MED ORDER — LEVOTHYROXINE SODIUM 100 MCG PO TABS: 100.0000 ug | ORAL_TABLET | Freq: Every day | ORAL | Status: DC

## 2013-03-12 MED ORDER — AMLODIPINE BESYLATE 10 MG PO TABS: 10.0000 mg | ORAL_TABLET | Freq: Every day | ORAL | Status: DC

## 2013-03-12 MED ORDER — LISINOPRIL 40 MG PO TABS: 40.0000 mg | ORAL_TABLET | Freq: Every day | ORAL | Status: DC

## 2013-03-12 MED ORDER — OMEPRAZOLE 20 MG PO CPDR: 40.0000 mg | DELAYED_RELEASE_CAPSULE | Freq: Every day | ORAL | Status: DC

## 2013-03-12 NOTE — Patient Instructions (Addendum)
Come back in 1-2 weeks to check your blood pressure and labs - come fasting - Ok to dble book  Measure your blood pressure and bring in the readings and your machine

## 2013-03-12 NOTE — Assessment & Plan Note (Signed)
Unsure of control.  Check labs once back on stable dose of medication

## 2013-03-12 NOTE — Assessment & Plan Note (Addendum)
Stable on baclofen.  Getting vicoden from pain center

## 2013-03-12 NOTE — Assessment & Plan Note (Signed)
Poorly controlled due to out of medications.  Restart and recheck control and labs

## 2013-03-12 NOTE — Progress Notes (Signed)
  Subjective:    Patient ID: Melvin Nguyen, male    DOB: 1965/08/31, 47 y.o.   MRN: [id number]  HPI  HYPERTENSION Disease Monitoring Home BP Monitoring not checking but does have manual cuff Chest pain- no    Dyspnea- no Medications Compliance-  Has been out of all medications for 5 days. Lightheadedness-  no  Edema- no ROS - See HPI  PMH Lab Review   Potassium  Date Value Range Status  05/03/2012 3.4* 3.5 - 5.3 mEq/L Final     Sodium  Date Value Range Status  05/03/2012 138  135 - 145 mEq/L Final     Creat  Date Value Range Status  05/03/2012 1.04  0.50 - 1.35 mg/dL Final     Creatinine, Ser  Date Value Range Status  07/01/2010 0.95  0.40-1.50 mg/dL Final     Back Pain Chronic with his cerebral palsy.  Baclofen helps.  No new weakenss or incontinence.  No lightheadness   Hypothyroid Out of levothyroid for 5 days.  No change in weight or skin dryness      Review of Symptoms - see HPI  PMH - Smoking status noted.        Review of Systems     Objective:   Physical Exam  Alert no acute distress Heart - Regular rate and rhythm.  No murmurs, gallops or rubs.    Lungs:  Normal respiratory effort, chest expands symmetrically. Lungs are clear to auscultation, no crackles or wheezes. Extremities:  No cyanosis, edema       Assessment & Plan:

## 2013-03-21 ENCOUNTER — Encounter: Payer: Self-pay | Admitting: Family Medicine

## 2013-03-21 ENCOUNTER — Ambulatory Visit: Payer: PRIVATE HEALTH INSURANCE | Admitting: Family Medicine

## 2013-03-21 ENCOUNTER — Telehealth: Payer: Self-pay | Admitting: Family Medicine

## 2013-03-21 DIAGNOSIS — R079 Chest pain, unspecified: Secondary | ICD-10-CM

## 2013-03-21 MED ORDER — OMEPRAZOLE 20 MG PO CPDR: 40.0000 mg | DELAYED_RELEASE_CAPSULE | Freq: Every day | ORAL | Status: DC

## 2013-03-21 NOTE — Telephone Encounter (Signed)
Pt is calling because [facility] did not receive the refill for his Prilosec JW

## 2013-03-21 NOTE — Telephone Encounter (Signed)
rx called into pharmacy

## 2013-03-22 ENCOUNTER — Telehealth: Payer: Self-pay | Admitting: Family Medicine

## 2013-03-22 NOTE — Telephone Encounter (Signed)
Left message to come in to have his blood pressure checked

## 2013-05-23 ENCOUNTER — Encounter: Payer: Self-pay | Admitting: Family Medicine

## 2013-05-23 ENCOUNTER — Ambulatory Visit (INDEPENDENT_AMBULATORY_CARE_PROVIDER_SITE_OTHER): Payer: PRIVATE HEALTH INSURANCE | Admitting: Family Medicine

## 2013-05-23 VITALS — BP 164/90 | HR 84 | Temp 98.6°F | Wt 178.0 lb

## 2013-05-23 DIAGNOSIS — E039 Hypothyroidism, unspecified: Secondary | ICD-10-CM

## 2013-05-23 DIAGNOSIS — F17200 Nicotine dependence, unspecified, uncomplicated: Secondary | ICD-10-CM

## 2013-05-23 DIAGNOSIS — I10 Essential (primary) hypertension: Secondary | ICD-10-CM

## 2013-05-23 DIAGNOSIS — M549 Dorsalgia, unspecified: Secondary | ICD-10-CM

## 2013-05-23 LAB — COMPREHENSIVE METABOLIC PANEL
ALT: 12 U/L (ref 0–53)
AST: 12 U/L (ref 0–37)
Alkaline Phosphatase: 51 U/L (ref 39–117)
Creat: 1.03 mg/dL (ref 0.50–1.35)
Sodium: 139 mEq/L (ref 135–145)
Total Bilirubin: 0.8 mg/dL (ref 0.3–1.2)

## 2013-05-23 NOTE — Assessment & Plan Note (Signed)
Unchanged - has plan to stop - he is confident he can

## 2013-05-23 NOTE — Assessment & Plan Note (Signed)
Unsure of control.  Check labs  °

## 2013-05-23 NOTE — Patient Instructions (Addendum)
Good to see you today!  Thanks for coming in.  YOur quit date for stopping smoking is - November 1  Go through all your medication bottles to make sure you are taking everything on this list  I will call you if your tests are not good.  Otherwise I will send you a letter.  If you do not hear from me with in 2 weeks please call our office.     Come back in 3 months

## 2013-05-23 NOTE — Progress Notes (Signed)
  Subjective:    Patient ID: Melvin Nguyen., male    DOB: 07-16-1966, 47 y.o.   MRN: [id number]  HPI  HYPERTENSION Disease Monitoring Home BP Monitoring not checking Chest pain- no    Dyspnea- no Medications Compliance-  Takes all medications at noon  Has been taking regularly. Lightheadedness-  no  Edema- no ROS - See HPI  TOBACCO Smokes 1/4 ppd.  Wants to stop.  Has in past with patches.  Will be moving to own place and then plans to stop with patches  HYPOTHYROIDISM Disease Monitoring Weight changes: no  Skin Changes: no Palpitations: no Heat/Cold intolerance: no Medication Monitoring Compliance:  Taking levothyroid regularly for weeks    Last TSH:   Lab Results  Component Value Date   TSH 3.361 11/29/2011     PMH Lab Review   Potassium  Date Value Range Status  05/03/2012 3.4* 3.5 - 5.3 mEq/L Final     Sodium  Date Value Range Status  05/03/2012 138  135 - 145 mEq/L Final     Creat  Date Value Range Status  05/03/2012 1.04  0.50 - 1.35 mg/dL Final     Creatinine, Ser  Date Value Range Status  07/01/2010 0.95  0.40-1.50 mg/dL Final           Review of Systems     Objective:   Physical Exam Alert no acute distress Lungs:  Normal respiratory effort, chest expands symmetrically. Lungs are clear to auscultation, no crackles or wheezes. Heart - Regular rate and rhythm.  No murmurs, gallops or rubs.    Extremities:  No cyanosis, edema, has moderate spastic gait      Assessment & Plan:

## 2013-05-23 NOTE — Assessment & Plan Note (Signed)
Improved.  Did not bring in bottles -asked to check at home.  Check labs

## 2013-05-24 ENCOUNTER — Encounter: Payer: Self-pay | Admitting: Family Medicine

## 2013-06-28 ENCOUNTER — Other Ambulatory Visit: Payer: Self-pay

## 2013-07-02 ENCOUNTER — Encounter: Payer: Self-pay | Admitting: Family Medicine

## 2013-07-02 ENCOUNTER — Ambulatory Visit (INDEPENDENT_AMBULATORY_CARE_PROVIDER_SITE_OTHER): Payer: PRIVATE HEALTH INSURANCE | Admitting: Family Medicine

## 2013-07-02 VITALS — BP 128/80 | HR 72 | Temp 98.6°F | Wt 182.0 lb

## 2013-07-02 DIAGNOSIS — E039 Hypothyroidism, unspecified: Secondary | ICD-10-CM

## 2013-07-02 DIAGNOSIS — F17200 Nicotine dependence, unspecified, uncomplicated: Secondary | ICD-10-CM

## 2013-07-02 DIAGNOSIS — I10 Essential (primary) hypertension: Secondary | ICD-10-CM

## 2013-07-02 MED ORDER — NICOTINE 21 MG/24HR TD PT24: 21.0000 mg | MEDICATED_PATCH | Freq: Every day | TRANSDERMAL | Status: DC

## 2013-07-02 NOTE — Assessment & Plan Note (Signed)
Good control

## 2013-07-02 NOTE — Patient Instructions (Signed)
Check with the pharmacy to see if they will pay for the nicotine patches.  Call us if you need another brand. The goal is to use the 21 mg patch for 2-3 weeks then go down to 14 mg for 2-3 weeks then 7 mg.  Do not smoke when taking the patch   Good to see you today!  Thanks for coming in.  Come back in 3-6 months

## 2013-07-02 NOTE — Progress Notes (Signed)
  Subjective:    Patient ID: Melvin Nguyen., male    DOB: Nov 18, 1965, 47 y.o.   MRN: [id number]  HPI  Tobacco Abuse Wants to stop. Was able to for several years using patches.  Cant afford OTC ones.  Wondering about Chantix.  Has history of depression.  No shortness of breath or hemoptysis  HYPERTENSION Disease Monitoring Home BP Monitoring under 140/90 when he checks Chest pain- no    Dyspnea- no Medications Compliance-  As ordered. Lightheadedness-  no  Edema- no ROS - See HPI  PMH Lab Review   Potassium  Date Value Range Status  05/23/2013 3.3* 3.5 - 5.3 mEq/L Final     Sodium  Date Value Range Status  05/23/2013 139  135 - 145 mEq/L Final     Creat  Date Value Range Status  05/23/2013 1.03  0.50 - 1.35 mg/dL Final     Creatinine, Ser  Date Value Range Status  07/01/2010 0.95  0.40-1.50 mg/dL Final            Review of Systems     Objective:   Physical Exam Alert no acute distress        Assessment & Plan:

## 2013-07-02 NOTE — Assessment & Plan Note (Addendum)
Normal TSH on current dose.

## 2013-07-02 NOTE — Assessment & Plan Note (Signed)
Not a good candidate for Chantix.  Will hope insurance will cover

## 2013-07-03 ENCOUNTER — Telehealth: Payer: Self-pay | Admitting: Family Medicine

## 2013-07-03 NOTE — Telephone Encounter (Signed)
He would like to have Nicotrol inhaler called in. His insurance will pay for this Pharmacy: [facility] in [place] When let pt know when this is done

## 2013-07-04 ENCOUNTER — Telehealth: Payer: Self-pay | Admitting: Family Medicine

## 2013-07-04 MED ORDER — NICOTINE 10 MG IN INHA: 1.0000 | RESPIRATORY_TRACT | Status: DC | PRN

## 2013-07-04 NOTE — Telephone Encounter (Signed)
Sent in  Please notify him Thanks LC

## 2013-07-04 NOTE — Telephone Encounter (Signed)
Patient would like a call as to if presciption for medication to quit smoking will be called in.

## 2013-07-04 NOTE — Telephone Encounter (Signed)
Pt informed. Melvin Nguyen  

## 2013-11-09 ENCOUNTER — Other Ambulatory Visit: Payer: Self-pay | Admitting: Family Medicine

## 2014-01-20 ENCOUNTER — Other Ambulatory Visit: Payer: Self-pay | Admitting: Family Medicine

## 2014-01-21 ENCOUNTER — Ambulatory Visit: Payer: PRIVATE HEALTH INSURANCE | Admitting: Family Medicine

## 2014-01-28 ENCOUNTER — Encounter: Payer: Self-pay | Admitting: Family Medicine

## 2014-01-28 ENCOUNTER — Ambulatory Visit (INDEPENDENT_AMBULATORY_CARE_PROVIDER_SITE_OTHER): Payer: PRIVATE HEALTH INSURANCE | Admitting: Family Medicine

## 2014-01-28 VITALS — BP 177/77 | HR 67 | Temp 98.7°F | Wt 178.0 lb

## 2014-01-28 DIAGNOSIS — G4730 Sleep apnea, unspecified: Secondary | ICD-10-CM

## 2014-01-28 DIAGNOSIS — I10 Essential (primary) hypertension: Secondary | ICD-10-CM

## 2014-01-28 LAB — BASIC METABOLIC PANEL
BUN: 8 mg/dL (ref 6–23)
CALCIUM: 8.7 mg/dL (ref 8.4–10.5)
CO2: 28 mEq/L (ref 19–32)
CREATININE: 0.97 mg/dL (ref 0.50–1.35)
Chloride: 103 mEq/L (ref 96–112)
Glucose, Bld: 95 mg/dL (ref 70–99)
Potassium: 3.1 mEq/L — ABNORMAL LOW (ref 3.5–5.3)
Sodium: 140 mEq/L (ref 135–145)

## 2014-01-28 MED ORDER — AMLODIPINE BESYLATE 10 MG PO TABS: 10.0000 mg | ORAL_TABLET | Freq: Every day | ORAL | Status: DC

## 2014-01-28 MED ORDER — CARVEDILOL 12.5 MG PO TABS: 12.5000 mg | ORAL_TABLET | Freq: Two times a day (BID) | ORAL | Status: DC

## 2014-01-28 MED ORDER — LISINOPRIL 40 MG PO TABS: 40.0000 mg | ORAL_TABLET | Freq: Every day | ORAL | Status: DC

## 2014-01-28 MED ORDER — HYDROCHLOROTHIAZIDE 25 MG PO TABS: 25.0000 mg | ORAL_TABLET | Freq: Every day | ORAL | Status: DC

## 2014-01-28 NOTE — Patient Instructions (Signed)
Good to see you today!  Thanks for coming in.  Your biggest task is to stop smoking - almost there  Call if you do not get the sleep apnea mask or have them fax me   Check your blood pressure regularly if not < 140/90 then come back  I will call you if your tests are not good.  Otherwise I will send you a letter.  If you do not hear from me with in 2 weeks please call our office.

## 2014-01-28 NOTE — Progress Notes (Signed)
   Subjective:    Patient ID: Melvin Nguyen., male    DOB: [birth year], 48 y.o.   MRN: [id number]  HPI HYPERTENSION Disease Monitoring Home BP checks sometimes this am was 155/100 before taking meds Chest pain- no    Dyspnea- no Medications Compliance-  Did not take hctz this am. Lightheadedness-  no  Edema- no ROS - See HPI  PMH Lab Review   Potassium  Date Value Ref Range Status  05/23/2013 3.3* 3.5 - 5.3 mEq/L Final     Sodium  Date Value Ref Range Status  05/23/2013 139  135 - 145 mEq/L Final     Creat  Date Value Ref Range Status  05/23/2013 1.03  0.50 - 1.35 mg/dL Final     Creatinine, Ser  Date Value Ref Range Status  07/01/2010 0.95  0.40-1.50 mg/dL Final       SLEEP APNEA Not using his CPAP because is broken.  Does feel day time tiredness and has troulbe sleeping   SMOKING Down to 3 per day.  Uses inhaler sometimes  MS pain - see pain management  Chief Complaint noted Review of Symptoms - see HPI PMH - Smoking status noted.       Review of Systems     Objective:   Physical Exam  Alert no acute distress Heart - Regular rate and rhythm.  No murmurs, gallops or rubs.    Lungs:  Normal respiratory effort, chest expands symmetrically. Lungs are clear to auscultation, no crackles or wheezes. No pedal edema       Assessment & Plan:  2

## 2014-01-28 NOTE — Assessment & Plan Note (Signed)
Not well controlled.  Liklely due to not taking hctz this am and untreated sleep apnea Get cpap fixed and monitor blood pressure at home

## 2014-01-28 NOTE — Assessment & Plan Note (Signed)
Not well controlled.  Cautioned he needs to get his CPAP fixed.

## 2014-01-29 ENCOUNTER — Encounter: Payer: Self-pay | Admitting: Family Medicine

## 2014-02-05 ENCOUNTER — Encounter: Payer: Self-pay | Admitting: Family Medicine

## 2014-02-09 ENCOUNTER — Other Ambulatory Visit: Payer: Self-pay | Admitting: Family Medicine

## 2014-06-05 ENCOUNTER — Encounter: Payer: Self-pay | Admitting: Family Medicine

## 2014-06-05 ENCOUNTER — Ambulatory Visit (INDEPENDENT_AMBULATORY_CARE_PROVIDER_SITE_OTHER): Payer: Medicare Other | Admitting: Family Medicine

## 2014-06-05 VITALS — BP 161/91 | HR 62 | Temp 98.5°F | Ht 63.0 in | Wt 174.4 lb

## 2014-06-05 DIAGNOSIS — Z23 Encounter for immunization: Secondary | ICD-10-CM

## 2014-06-05 DIAGNOSIS — E038 Other specified hypothyroidism: Secondary | ICD-10-CM

## 2014-06-05 DIAGNOSIS — I10 Essential (primary) hypertension: Secondary | ICD-10-CM

## 2014-06-05 DIAGNOSIS — Z720 Tobacco use: Secondary | ICD-10-CM | POA: Diagnosis not present

## 2014-06-05 DIAGNOSIS — E876 Hypokalemia: Secondary | ICD-10-CM

## 2014-06-05 DIAGNOSIS — F17200 Nicotine dependence, unspecified, uncomplicated: Secondary | ICD-10-CM

## 2014-06-05 LAB — COMPREHENSIVE METABOLIC PANEL
ALT: 12 U/L (ref 0–53)
AST: 14 U/L (ref 0–37)
Albumin: 3.9 g/dL (ref 3.5–5.2)
Alkaline Phosphatase: 48 U/L (ref 39–117)
BUN: 8 mg/dL (ref 6–23)
CO2: 29 mEq/L (ref 19–32)
Calcium: 9 mg/dL (ref 8.4–10.5)
Chloride: 103 mEq/L (ref 96–112)
Creat: 0.88 mg/dL (ref 0.50–1.35)
Glucose, Bld: 127 mg/dL — ABNORMAL HIGH (ref 70–99)
Potassium: 3.3 mEq/L — ABNORMAL LOW (ref 3.5–5.3)
Sodium: 141 mEq/L (ref 135–145)
Total Bilirubin: 0.6 mg/dL (ref 0.2–1.2)
Total Protein: 6.4 g/dL (ref 6.0–8.3)

## 2014-06-05 LAB — TSH: TSH: 1.692 u[IU]/mL (ref 0.350–4.500)

## 2014-06-05 MED ORDER — AMLODIPINE BESYLATE 10 MG PO TABS: 10.0000 mg | ORAL_TABLET | Freq: Every day | ORAL | Status: DC

## 2014-06-05 MED ORDER — HYDROCHLOROTHIAZIDE 25 MG PO TABS: 25.0000 mg | ORAL_TABLET | Freq: Every day | ORAL | Status: DC

## 2014-06-05 MED ORDER — LEVOTHYROXINE SODIUM 100 MCG PO TABS: 100.0000 ug | ORAL_TABLET | Freq: Every day | ORAL | Status: DC

## 2014-06-05 NOTE — Progress Notes (Signed)
   Subjective:    Patient ID: Samuel GermanyJames Irby Jr., male    DOB: [birth year], 48 y.o.   MRN: [id number]  HPI  HYPERTENSION Disease Monitoring Home BP Monitoring : 170/90s Chest pain- no    Dyspnea- no Medications Compliance-  Out of hctz for weeks. Lightheadedness-  no  Edema- no ROS - See HPI  PMH Lab Review   Potassium  Date Value Ref Range Status  01/28/2014 3.1* 3.5 - 5.3 mEq/L Final     Sodium  Date Value Ref Range Status  01/28/2014 140  135 - 145 mEq/L Final     Creat  Date Value Ref Range Status  01/28/2014 0.97  0.50 - 1.35 mg/dL Final     Creatinine, Ser  Date Value Ref Range Status  07/01/2010 0.95  0.40-1.50 mg/dL Final      HYPOTHYROIDISM Disease Monitoring Weight changes: no  Skin Changes: no Palpitations: no Heat/Cold intolerance: no  Medication Monitoring Compliance:  Is taking regularly   Last TSH:   Lab Results  Component Value Date   TSH 2.857 05/23/2013   TOBACCO USE Down to 2 cig per day. Plans to stop   Chief Complaint noted Review of Symptoms - see HPI PMH - Smoking status noted.   Vital Signs reviewed   Review of Systems     Objective:   Physical Exam   Heart - Regular rate and rhythm.  No murmurs, gallops or rubs.    Lungs:  Normal respiratory effort, chest expands symmetrically. Lungs are clear to auscultation, no crackles or wheezes. Extremities:  No cyanosis, edema, or deformity noted with good range of motion of all major joints.         Assessment & Plan:

## 2014-06-05 NOTE — Assessment & Plan Note (Signed)
Improved down to 2 cigs.

## 2014-06-05 NOTE — Assessment & Plan Note (Signed)
Not at goal likely due to not taking hctz.  Will restart.  Check labs

## 2014-06-05 NOTE — Patient Instructions (Signed)
Good to see you today!  Thanks for coming in.  Might as well stop smoking completely!!  Get your HCTZ filled and take every day.  Call if your blood pressure is not usually < 150/90  I will call you if your tests are not good.  Otherwise I will send you a letter.  If you do not hear from me with in 2 weeks please call our office.

## 2014-06-05 NOTE — Assessment & Plan Note (Signed)
Clinically stble.  Check tsh

## 2014-06-06 ENCOUNTER — Encounter: Payer: Self-pay | Admitting: Family Medicine

## 2014-06-06 DIAGNOSIS — E876 Hypokalemia: Secondary | ICD-10-CM | POA: Insufficient documentation

## 2014-06-06 MED ORDER — POTASSIUM CHLORIDE ER 10 MEQ PO TBCR: 10.0000 meq | EXTENDED_RELEASE_TABLET | Freq: Every day | ORAL | Status: DC

## 2014-06-06 NOTE — Assessment & Plan Note (Addendum)
Has persistent low K which will likely worsen once restarts HCTZ.  Despite being on ACEI will add low dose K and check for resolutinon

## 2014-06-06 NOTE — Addendum Note (Signed)
Addended by: Pearlean BrownieHAMBLISS, Juaquin Ludington L on: 06/06/2014 03:55 PM   Modules accepted: Orders

## 2014-07-05 ENCOUNTER — Encounter: Payer: Self-pay | Admitting: Family Medicine

## 2014-07-29 ENCOUNTER — Other Ambulatory Visit: Payer: Self-pay | Admitting: Family Medicine

## 2014-09-04 DIAGNOSIS — M19011 Primary osteoarthritis, right shoulder: Secondary | ICD-10-CM | POA: Diagnosis not present

## 2014-09-11 ENCOUNTER — Ambulatory Visit (HOSPITAL_COMMUNITY)
Admission: RE | Admit: 2014-09-11 | Discharge: 2014-09-11 | Disposition: A | Discharge status: Home / Self Care | Payer: Medicare Other | Source: Ambulatory Visit | Attending: Family Medicine | Admitting: Family Medicine

## 2014-09-11 ENCOUNTER — Encounter: Payer: Self-pay | Admitting: Family Medicine

## 2014-09-11 ENCOUNTER — Ambulatory Visit (INDEPENDENT_AMBULATORY_CARE_PROVIDER_SITE_OTHER): Payer: 59 | Admitting: Family Medicine

## 2014-09-11 VITALS — BP 153/92 | HR 61 | Temp 97.8°F | Ht 63.0 in | Wt 180.0 lb

## 2014-09-11 DIAGNOSIS — R079 Chest pain, unspecified: Secondary | ICD-10-CM

## 2014-09-11 DIAGNOSIS — E039 Hypothyroidism, unspecified: Secondary | ICD-10-CM | POA: Diagnosis not present

## 2014-09-11 LAB — CBC
HCT: 41 % (ref 39.0–52.0)
HEMOGLOBIN: 14 g/dL (ref 13.0–17.0)
MCH: 29.4 pg (ref 26.0–34.0)
MCHC: 34.1 g/dL (ref 30.0–36.0)
MCV: 86.1 fL (ref 78.0–100.0)
MPV: 9.1 fL (ref 8.6–12.4)
Platelets: 326 10*3/uL (ref 150–400)
RBC: 4.76 MIL/uL (ref 4.22–5.81)
RDW: 13.4 % (ref 11.5–15.5)
WBC: 8.3 10*3/uL (ref 4.0–10.5)

## 2014-09-11 LAB — BASIC METABOLIC PANEL
BUN: 8 mg/dL (ref 6–23)
CO2: 32 meq/L (ref 19–32)
Calcium: 8.6 mg/dL (ref 8.4–10.5)
Chloride: 101 mEq/L (ref 96–112)
Creat: 0.85 mg/dL (ref 0.50–1.35)
GLUCOSE: 71 mg/dL (ref 70–99)
Potassium: 3.2 mEq/L — ABNORMAL LOW (ref 3.5–5.3)
Sodium: 140 mEq/L (ref 135–145)

## 2014-09-11 LAB — TSH: TSH: 3.473 u[IU]/mL (ref 0.350–4.500)

## 2014-09-11 NOTE — Patient Instructions (Addendum)
Good to see you today!  Thanks for coming in.  You need to see a cardiologist for a test to make sure this pain is not your heart  If you get chest pain that lasts for more than 10 minutes you need to call 911  If the pains seem to be worsening - come on more frequently or are more painful call us immediately  Continue to take all your medications as you are prescribed  Stop smoking completely  I will call you if your tests are not good.  Otherwise I will send you a letter.  If you do not hear from me with in 2 weeks please call our office.

## 2014-09-11 NOTE — Progress Notes (Signed)
   Subjective:    Patient ID: Melvin GermanyJames Marsala Jr., male    DOB: 17-Jun-1966, 49 y.o.   MRN: [id number]  HPI  CHEST PAIN  Chest pain began 3 weeks ago.  Never had before.  Has had 3-4 times since always with "rushing" Pain is: left sided achy pain that comes on when he is rushing How severe is the pain: he slows down and goes away What worsens the pain: exertiono What makes the pain better: rest Radiation: none  PMH History of blood clot or heart problems or aneurysms: no  Symptoms Nausea/vomiting: no Shortness of breath: no Pleuritic pain: no Cough: no Swelling of legs: no Syncope: no Heart burn or food sticking: no Immobility: no  FH - Father had MI in late 6350s  Review of Symptoms - see HPI PMH - Smoking status noted.      Review of Systems     Objective:   Physical Exam Alert no acute distress Heart - Regular rate and rhythm.  No murmurs, gallops or rubs.    Lungs:  Normal respiratory effort, chest expands symmetrically. Lungs are clear to auscultation, no crackles or wheezes. Extremities:  No cyanosis, edema, No pain or swelling in calves Abdomen: soft and non-tender without masses, organomegaly or hernias noted.  No guarding or rebound  EKG - no ACS changes        Assessment & Plan:

## 2014-09-11 NOTE — Assessment & Plan Note (Signed)
Seems consistent with stable angina.  Has had longer than 2 weeks with stable symptoms and normal ecg.  Will refer for outpatient cardiac testing.  Continue current medications

## 2014-09-12 ENCOUNTER — Telehealth: Payer: Self-pay | Admitting: Family Medicine

## 2014-09-12 ENCOUNTER — Other Ambulatory Visit: Payer: Self-pay | Admitting: Family Medicine

## 2014-09-12 ENCOUNTER — Encounter: Payer: Self-pay | Admitting: Family Medicine

## 2014-09-12 MED ORDER — POTASSIUM CHLORIDE ER 10 MEQ PO TBCR: 20.0000 meq | EXTENDED_RELEASE_TABLET | Freq: Every day | ORAL | Status: DC

## 2014-09-12 NOTE — Telephone Encounter (Signed)
Left message that I was sending him a letter.  If he does not get it then call us

## 2014-10-01 ENCOUNTER — Ambulatory Visit: Payer: 59 | Admitting: Cardiology

## 2014-10-01 DIAGNOSIS — M1990 Unspecified osteoarthritis, unspecified site: Secondary | ICD-10-CM | POA: Diagnosis not present

## 2014-10-01 DIAGNOSIS — M24011 Loose body in right shoulder: Secondary | ICD-10-CM | POA: Diagnosis not present

## 2014-10-01 DIAGNOSIS — M5136 Other intervertebral disc degeneration, lumbar region: Secondary | ICD-10-CM | POA: Diagnosis not present

## 2014-10-01 DIAGNOSIS — M47816 Spondylosis without myelopathy or radiculopathy, lumbar region: Secondary | ICD-10-CM | POA: Diagnosis not present

## 2014-10-01 DIAGNOSIS — Z79891 Long term (current) use of opiate analgesic: Secondary | ICD-10-CM | POA: Diagnosis not present

## 2014-10-03 ENCOUNTER — Ambulatory Visit (INDEPENDENT_AMBULATORY_CARE_PROVIDER_SITE_OTHER): Payer: 59 | Admitting: Cardiology

## 2014-10-03 ENCOUNTER — Encounter: Payer: Self-pay | Admitting: Cardiology

## 2014-10-03 VITALS — BP 148/92 | HR 63 | Ht 63.0 in | Wt 180.1 lb

## 2014-10-03 DIAGNOSIS — E876 Hypokalemia: Secondary | ICD-10-CM

## 2014-10-03 DIAGNOSIS — R079 Chest pain, unspecified: Secondary | ICD-10-CM | POA: Diagnosis not present

## 2014-10-03 DIAGNOSIS — G809 Cerebral palsy, unspecified: Secondary | ICD-10-CM | POA: Diagnosis not present

## 2014-10-03 DIAGNOSIS — I10 Essential (primary) hypertension: Secondary | ICD-10-CM

## 2014-10-03 NOTE — Progress Notes (Signed)
Melvin Nguyen Date of Birth:  Jul 17, 1966 [facility] [address] [place], [place]  [address] [phone]        Fax   [phone]   History of Present Illness: This pleasant 49 year old gentleman is seen by me for the first time today.  He is seen at the request of Dr. Deirdre Priest for evaluation of chest pain.  The patient does not have any history of known coronary disease.  He states that he had a stress test about 10 years ago which was negative.  He states that for the past month he has been having intermittent substernal chest discomfort which lasts 3-4 minutes.  It occurs once or twice a week.  It seems to be worse with activity or if he gets in a hurry or gets excited.  The discomfort is substernal.  There is no radiation down his arm.  There are no associated symptoms of nausea or diaphoresis.  She has a history of high blood pressure since 2005.  He is not diabetic.  He smokes less than a pack of cigarettes a day.  He does not have any known history of hypercholesterolemia that he is aware of. His past medical history reveals that he does have cerebral palsy.  He is on disability.  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  He also takes care of numerous dogs and cats. His family history reveals that his father died of a massive heart attack at a relatively young age.  His mother died of kidney failure.  Current Outpatient Prescriptions  Medication Sig Dispense Refill  . amLODipine (NORVASC) 10 MG tablet Take 1 tablet (10 mg total) by mouth daily. 90 tablet 2  . baclofen (LIORESAL) 20 MG tablet take 1 tablet by mouth three times a day 90 tablet 3  . carvedilol (COREG) 12.5 MG tablet Take 1 tablet (12.5 mg total) by mouth 2 (two) times daily with a meal. 180 tablet 2  . hydrochlorothiazide (HYDRODIURIL) 25 MG tablet Take 1 tablet (25 mg total) by mouth daily. 90 tablet 2  . HYDROcodone-acetaminophen (NORCO) 10-325 MG  per tablet Take 1 tablet by mouth every 6 (six) hours as needed.    Marland Kitchen levothyroxine (SYNTHROID, LEVOTHROID) 100 MCG tablet Take 1 tablet (100 mcg total) by mouth daily. 90 tablet 2  . lisinopril (PRINIVIL,ZESTRIL) 40 MG tablet Take 1 tablet (40 mg total) by mouth daily. 90 tablet 2  . nicotine (NICOTROL) 10 MG inhaler Inhale 1 cartridge (1 continuous puffing total) into the lungs as needed for smoking cessation. 42 each 0  . omeprazole (PRILOSEC) 20 MG capsule take 1 capsule by mouth once daily 90 capsule 1  . potassium chloride (K-DUR) 10 MEQ tablet Take 2 tablets (20 mEq total) by mouth daily. 60 tablet 3  . QUEtiapine (SEROQUEL) 50 MG tablet Take 1 tablet (50 mg total) by mouth at bedtime. 30 tablet 0   No current facility-administered medications for this visit.    Allergies  Allergen Reactions  . Penicillins     REACTION: hives/itching    Patient Active Problem List   Diagnosis Date Noted  . Chest pain 09/11/2014  . Hypokalemia 06/06/2014  . Back pain 11/29/2011  . Obesity (BMI 30-39.9) 09/13/2011  . 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 TOBACCO ABUSE 05/22/2009  . Essential hypertension 05/22/2009  . Hypothyroidism 02/13/2007  . ANXIETY DEPRESSION 02/13/2007  . CEREBRAL PALSY 02/13/2007  . SLEEP APNEA  02/13/2007    History  Smoking status  . Current Every Day Smoker -- 1.00 packs/day  . Types: Cigarettes  Smokeless tobacco  . Not on file    History  Alcohol Use: Not on file    History reviewed. No pertinent family history.  Review of Systems: Constitutional: no fever chills diaphoresis or fatigue or change in weight.  Head and neck: no hearing loss, no epistaxis, no photophobia or visual disturbance. Respiratory: No cough, shortness of breath or wheezing. Cardiovascular: Positive for intermittent chest discomfort Gastrointestinal: No abdominal distention, no abdominal pain, no change in bowel habits hematochezia or melena. Genitourinary: No dysuria, no  frequency, no urgency, no nocturia. Musculoskeletal:No arthralgias, no back pain, no gait disturbance or myalgias. Neurological: No dizziness, no headaches, no numbness, no seizures, no syncope, no weakness, no tremors. Hematologic: No lymphadenopathy, no easy bruising. Psychiatric: No confusion, no hallucinations, no sleep disturbance.   Wt Readings from Last 3 Encounters:  10/03/14 180 lb 1.9 oz (81.702 kg)  09/11/14 180 lb (81.647 kg)  06/05/14 174 lb 6.4 oz (79.107 kg)    Physical Exam: Filed Vitals:   10/03/14 1504  BP: 148/92  Pulse: 63  The patient appears to be in no distress.  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  The extraocular movements are full.  There is no scleral icterus.  Mouth and pharynx are benign.  No lymphadenopathy.  No carotid bruits.  The jugular venous pressure is normal.  Thyroid is not enlarged or tender.  Chest is clear to percussion and auscultation.  No rales or rhonchi.  Expansion of the chest is symmetrical.  Heart reveals no abnormal lift or heave.  First and second heart sounds are normal.  There is no murmur gallop rub or click.  The abdomen is soft and nontender.  Bowel sounds are normoactive.  There is no hepatosplenomegaly or mass.  There are no abdominal bruits.  Extremities reveal no phlebitis or edema.  Pedal pulses are good.  There is no cyanosis or clubbing.  Neurologic exam is normal strength and no lateralizing weakness.  No sensory deficits.  Integument reveals no rash  EKG shows normal sinus rhythm and is within normal limits.  Assessment / Plan: 1.  Chest pain, etiology undetermined.  Rule out ischemic heart disease with angina pectoris. 2.  Essential hypertension 3.  History of cerebral palsy 4.  Tobacco abuse 5.  Past history of hypokalemia.  He has not been taking any potassium supplementation  although there is some listed in his chart.  He has been trying to eat a banana daily  Disposition: The patient was encouraged in smoking cessation.  We are checking a basal metabolic panel today.  We will have him return for a Lexiscan Myoview stress test.  Many thanks for the opportunity to see his pleasant gentleman with you.  I will be in touch regarding the results of his stress test.

## 2014-10-03 NOTE — Patient Instructions (Signed)
Will obtain labs today and call you with the results (BMET)  Your physician recommends that you continue on your current medications as directed. Please refer to the Current Medication list given to you today.  Your physician has requested that you have a lexiscan myoview. For further information please visit [email or web address] Please follow instruction sheet, as given.  FOLLOW UP AS NEEDED   Smoking Cessation Quitting smoking is important to your health and has many advantages. However, it is not always easy to quit since nicotine is a very addictive drug. Oftentimes, people try 3 times or more before being able to quit. This document explains the best ways for you to prepare to quit smoking. Quitting takes hard work and a lot of effort, but you can do it. ADVANTAGES OF QUITTING SMOKING  You will live longer, feel better, and live better.  Your body will feel the impact of quitting smoking almost immediately.  Within 20 minutes, blood pressure decreases. Your pulse returns to its normal level.  After 8 hours, carbon monoxide levels in the blood return to normal. Your oxygen level increases.  After 24 hours, the chance of having a heart attack starts to decrease. Your breath, hair, and body stop smelling like smoke.  After 48 hours, damaged nerve endings begin to recover. Your sense of taste and smell improve.  After 72 hours, the body is virtually free of nicotine. Your bronchial tubes relax and breathing becomes easier.  After 2 to 12 weeks, lungs can hold more air. Exercise becomes easier and circulation improves.  The risk of having a heart attack, stroke, cancer, or lung disease is greatly reduced.  After 1 year, the risk of coronary heart disease is cut in half.  After 5 years, the risk of stroke falls to the same as a nonsmoker.  After 10 years, the risk of lung cancer is cut in half and the risk of other cancers decreases significantly.  After 15 years, the risk of  coronary heart disease drops, usually to the level of a nonsmoker.  If you are pregnant, quitting smoking will improve your chances of having a healthy baby.  The people you live with, especially any children, will be healthier.  You will have extra money to spend on things other than cigarettes. QUESTIONS TO THINK ABOUT BEFORE ATTEMPTING TO QUIT You may want to talk about your answers with your health care provider.  Why do you want to quit?  If you tried to quit in the past, what helped and what did not?  What will be the most difficult situations for you after you quit? How will you plan to handle them?  Who can help you through the tough times? Your family? Friends? A health care provider?  What pleasures do you get from smoking? What ways can you still get pleasure if you quit? Here are some questions to ask your health care provider:  How can you help me to be successful at quitting?  What medicine do you think would be best for me and how should I take it?  What should I do if I need more help?  What is smoking withdrawal like? How can I get information on withdrawal? GET READY  Set a quit date.  Change your environment by getting rid of all cigarettes, ashtrays, matches, and lighters in your home, car, or work. Do not let people smoke in your home.  Review your past attempts to quit. Think about what worked and what did not. GET  SUPPORT AND ENCOURAGEMENT You have a better chance of being successful if you have help. You can get support in many ways.  Tell your family, friends, and coworkers that you are going to quit and need their support. Ask them not to smoke around you.  Get individual, group, or telephone counseling and support. Programs are available at [employer] and health centers. Call your local health department for information about programs in your area.  Spiritual beliefs and practices may help some smokers quit.  Download a "quit meter" on your  computer to keep track of quit statistics, such as how long you have gone without smoking, cigarettes not smoked, and money saved.  Get a self-help book about quitting smoking and staying off tobacco. LEARN NEW SKILLS AND BEHAVIORS  Distract yourself from urges to smoke. Talk to someone, go for a walk, or occupy your time with a task.  Change your normal routine. Take a different route to work. Drink tea instead of coffee. Eat breakfast in a different place.  Reduce your stress. Take a hot bath, exercise, or read a book.  Plan something enjoyable to do every day. Reward yourself for not smoking.  Explore interactive web-based programs that specialize in helping you quit. GET MEDICINE AND USE IT CORRECTLY Medicines can help you stop smoking and decrease the urge to smoke. Combining medicine with the above behavioral methods and support can greatly increase your chances of successfully quitting smoking.  Nicotine replacement therapy helps deliver nicotine to your body without the negative effects and risks of smoking. Nicotine replacement therapy includes nicotine gum, lozenges, inhalers, nasal sprays, and skin patches. Some may be available over-the-counter and others require a prescription.  Antidepressant medicine helps people abstain from smoking, but how this works is unknown. This medicine is available by prescription.  Nicotinic receptor partial agonist medicine simulates the effect of nicotine in your brain. This medicine is available by prescription. Ask your health care provider for advice about which medicines to use and how to use them based on your health history. Your health care provider will tell you what side effects to look out for if you choose to be on a medicine or therapy. Carefully read the information on the package. Do not use any other product containing nicotine while using a nicotine replacement product.  RELAPSE OR DIFFICULT SITUATIONS Most relapses occur within the  first 3 months after quitting. Do not be discouraged if you start smoking again. Remember, most people try several times before finally quitting. You may have symptoms of withdrawal because your body is used to nicotine. You may crave cigarettes, be irritable, feel very hungry, cough often, get headaches, or have difficulty concentrating. The withdrawal symptoms are only temporary. They are strongest when you first quit, but they will go away within 10-14 days. To reduce the chances of relapse, try to:  Avoid drinking alcohol. Drinking lowers your chances of successfully quitting.  Reduce the amount of caffeine you consume. Once you quit smoking, the amount of caffeine in your body increases and can give you symptoms, such as a rapid heartbeat, sweating, and anxiety.  Avoid smokers because they can make you want to smoke.  Do not let weight gain distract you. Many smokers will gain weight when they quit, usually less than 10 pounds. Eat a healthy diet and stay active. You can always lose the weight gained after you quit.  Find ways to improve your mood other than smoking. FOR MORE INFORMATION  www.smokefree.gov  Document  Released: 08/03/2001 Document Revised: 12/24/2013 Document Reviewed: 11/18/2011 [facility] Patient Information 2015 [employer] [place]. This information is not intended to replace advice given to you by your health care provider. Make sure you discuss any questions you have with your health care provider.

## 2014-10-04 ENCOUNTER — Ambulatory Visit: Payer: 59 | Admitting: Cardiology

## 2014-10-04 LAB — BASIC METABOLIC PANEL
BUN: 11 mg/dL (ref 6–23)
CHLORIDE: 106 meq/L (ref 96–112)
CO2: 29 mEq/L (ref 19–32)
Calcium: 9 mg/dL (ref 8.4–10.5)
Creatinine, Ser: 1.01 mg/dL (ref 0.40–1.50)
GFR: 83.03 mL/min (ref >60.00)
GLUCOSE: 97 mg/dL (ref 70–99)
POTASSIUM: 3.9 meq/L (ref 3.5–5.1)
Sodium: 140 mEq/L (ref 135–145)

## 2014-10-14 ENCOUNTER — Ambulatory Visit (HOSPITAL_COMMUNITY): Payer: Medicare Other | Attending: Cardiovascular Disease | Admitting: Radiology

## 2014-10-14 DIAGNOSIS — R079 Chest pain, unspecified: Secondary | ICD-10-CM | POA: Diagnosis not present

## 2014-10-14 DIAGNOSIS — I10 Essential (primary) hypertension: Secondary | ICD-10-CM | POA: Insufficient documentation

## 2014-10-14 MED ORDER — TECHNETIUM TC 99M SESTAMIBI GENERIC - CARDIOLITE
10.0000 | Freq: Once | INTRAVENOUS | Status: AC | PRN
  Administered 2014-10-14: 10 via INTRAVENOUS

## 2014-10-14 MED ORDER — TECHNETIUM TC 99M SESTAMIBI GENERIC - CARDIOLITE
30.0000 | Freq: Once | INTRAVENOUS | Status: AC | PRN
  Administered 2014-10-14: 30 via INTRAVENOUS

## 2014-10-14 MED ORDER — REGADENOSON 0.4 MG/5ML IV SOLN
0.4000 mg | Freq: Once | INTRAVENOUS | Status: AC
  Administered 2014-10-14: 0.4 mg via INTRAVENOUS

## 2014-10-14 NOTE — Progress Notes (Signed)
[facility] SITE 3 NUCLEAR MED [address] [place], [place] [address] [phone]    Cardiology Nuclear Med Study  Carman ChingJames Syler is a 49 y.o. male     MRN : [id number]     DOB: 1966/02/27  Procedure Date: 10/14/2014  Nuclear Med Background Indication for Stress Test:  Evaluation for Ischemia History:  MPI ~10 yrs ago, Asthma as a child Cardiac Risk Factors: Hypertension  Symptoms:  Chest Pain with Exertion (last date of chest discomfort was two days ago)   Nuclear Pre-Procedure Caffeine/Decaff Intake:  None> 12 hrs NPO After: 7:00am orange juice  Lungs:  clear O2 Sat: 98% on room air. IV 0.9% NS with Angio Cath:  22g  IV Site: R Antecubital x 1, tolerated well IV Started by:  Irean HongPatsy Kendzierski, RN  Chest Size (in):  42 Cup Size: n/a  Height: 5\' 3"  (1.6 m)  Weight:  174 lb (78.926 kg)  BMI:  Body mass index is 30.83 kg/(m^2). Tech Comments:  No medications this am per patient. Irean HongPatsy Defibaugh, RN.    Nuclear Med Study 1 or 2 day study: 1 day  Stress Test Type:  Eugenie BirksLexiscan  Reading MD: N/A  Order Authorizing Provider:  Cassell Clementhomas Brackbill, MD  Resting Radionuclide: Technetium 9260m Sestamibi  Resting Radionuclide Dose: 11.0 mCi   Stress Radionuclide:  Technetium 4060m Sestamibi  Stress Radionuclide Dose: 33.0 mCi           Stress Protocol Rest HR: 55 Stress HR: 76  Rest BP: 156/103 Stress BP: 138/84  Exercise Time (min): n/a METS: n/a   Predicted Max HR: 170 bpm % Max HR: 44.71 bpm Rate Pressure Product: [id number]   Dose of Adenosine (mg):  n/a Dose of Lexiscan: 0.4 mg  Dose of Atropine (mg): n/a Dose of Dobutamine: n/a mcg/kg/min (at max HR)  Stress Test Technologist: Nelson ChimesSharon Brooks, BS-ES  Nuclear Technologist:  Kerby NoraElzbieta Kubak, CNMT     Rest Procedure:  Myocardial perfusion imaging was performed at rest 45 minutes following the intravenous administration of Technetium 6460m Sestamibi. Rest ECG: Sinus bradycardial with no ST changes.  Stress Procedure:  The patient  received IV Lexiscan 0.4 mg over 15-seconds.  Technetium 4260m Sestamibi injected at 30-seconds.  Quantitative spect images were obtained after a 45 minute delay.  During the infusion of Lexiscan the patient complained of cough, SOB and stomach pain.  These symptoms began to resolve in recovery.  Stress ECG: No significant ST segment change suggestive of ischemia.  QPS Raw Data Images:  Acquisition technically good; normal left ventricular size. Stress Images:  There is decreased uptake in the apex. Rest Images:  There is decreased uptake in the apex. Subtraction (SDS):  No evidence of ischemia. Transient Ischemic Dilatation (Normal <1.22):  0.96 Lung/Heart Ratio (Normal <0.45):  0.30  Quantitative Gated Spect Images QGS EDV:  96 ml QGS ESV:  43 ml  Impression Exercise Capacity:  Lexiscan with no exercise. BP Response:  Normal blood pressure response. Clinical Symptoms:  There is dyspnea. ECG Impression:  No significant ST segment change suggestive of ischemia. Comparison with Prior Nuclear Study: No previous nuclear study performed  Overall Impression:  Low risk stress nuclear study with a small, severe, fixed apical defect consistent with prominent thinning; no ischemia.  LV Ejection Fraction: 55%.  LV Wall Motion:  NL LV Function; NL Wall Motion   Olga MillersBrian Crenshaw

## 2014-10-17 ENCOUNTER — Emergency Department (HOSPITAL_COMMUNITY)
Admission: EM | Admit: 2014-10-17 | Discharge: 2014-10-17 | Disposition: A | Discharge status: Home / Self Care | Payer: Medicare Other | Attending: Emergency Medicine | Admitting: Emergency Medicine

## 2014-10-17 ENCOUNTER — Encounter (HOSPITAL_COMMUNITY): Payer: Self-pay | Admitting: Emergency Medicine

## 2014-10-17 DIAGNOSIS — I10 Essential (primary) hypertension: Secondary | ICD-10-CM | POA: Insufficient documentation

## 2014-10-17 DIAGNOSIS — Z880 Allergy status to penicillin: Secondary | ICD-10-CM | POA: Diagnosis not present

## 2014-10-17 DIAGNOSIS — Z720 Tobacco use: Secondary | ICD-10-CM | POA: Insufficient documentation

## 2014-10-17 DIAGNOSIS — E669 Obesity, unspecified: Secondary | ICD-10-CM | POA: Insufficient documentation

## 2014-10-17 DIAGNOSIS — M549 Dorsalgia, unspecified: Secondary | ICD-10-CM | POA: Insufficient documentation

## 2014-10-17 DIAGNOSIS — E079 Disorder of thyroid, unspecified: Secondary | ICD-10-CM | POA: Diagnosis not present

## 2014-10-17 DIAGNOSIS — Z79899 Other long term (current) drug therapy: Secondary | ICD-10-CM | POA: Insufficient documentation

## 2014-10-17 DIAGNOSIS — M25519 Pain in unspecified shoulder: Secondary | ICD-10-CM | POA: Diagnosis not present

## 2014-10-17 DIAGNOSIS — Z760 Encounter for issue of repeat prescription: Secondary | ICD-10-CM | POA: Insufficient documentation

## 2014-10-17 DIAGNOSIS — R1110 Vomiting, unspecified: Secondary | ICD-10-CM | POA: Insufficient documentation

## 2014-10-17 DIAGNOSIS — M542 Cervicalgia: Secondary | ICD-10-CM | POA: Diagnosis not present

## 2014-10-17 DIAGNOSIS — M25511 Pain in right shoulder: Secondary | ICD-10-CM | POA: Diagnosis not present

## 2014-10-17 DIAGNOSIS — G8929 Other chronic pain: Secondary | ICD-10-CM | POA: Diagnosis not present

## 2014-10-17 DIAGNOSIS — M545 Low back pain: Secondary | ICD-10-CM | POA: Diagnosis not present

## 2014-10-17 HISTORY — DX: Essential (primary) hypertension: I10

## 2014-10-17 HISTORY — DX: Cerebral palsy, unspecified: G80.9

## 2014-10-17 HISTORY — DX: Disorder of thyroid, unspecified: E07.9

## 2014-10-17 MED ORDER — HYDROCODONE-ACETAMINOPHEN 5-325 MG PO TABS: 1.0000 | ORAL_TABLET | ORAL | Status: DC | PRN

## 2014-10-17 NOTE — Discharge Instructions (Signed)
As a courtesy, a prescription for Norco has been given. Please see your primary physician for any additional pain management issues. The emergency department is happy to assist you with any emergency situation, however we are not set up for chronic pain management. Medication Refill, Emergency Department We have refilled your medication today as a courtesy to you. It is best for your medical care, however, to take care of getting refills done through your primary caregiver's office. They have your records and can do a better job of follow-up than we can in the emergency department. On maintenance medications, we often only prescribe enough medications to get you by until you are able to see your regular caregiver. This is a more expensive way to refill medications. In the future, please plan for refills so that you will not have to use the emergency department for this. Thank you for your help. Your help allows us to better take care of the daily emergencies that enter our department. Document Released: 11/26/2003 Document Revised: 11/01/2011 Document Reviewed: 11/16/2013 [facility] Patient Information 2015 [place], [place]. This information is not intended to replace advice given to you by your health care provider. Make sure you discuss any questions you have with your health care provider.

## 2014-10-17 NOTE — ED Notes (Signed)
Vomiting x 2 days, pt having back and shoulder pain, chronic pain, pt was released from pain clinic, has not had pain meds in 5 days, pt has appointment with family doctor Wednesday to get referal to new pain clinic, needs meds til then.

## 2014-10-17 NOTE — ED Provider Notes (Signed)
CSN: [id number]     Arrival date & time 10/17/14  1245 History   First MD Initiated Contact with Patient 10/17/14 1434     Chief Complaint  Patient presents with  . Back Pain     (Consider location/radiation/quality/duration/timing/severity/associated sxs/prior Treatment) HPI Comments: Patient presents to the emergency department with complaint of back pain, neck pain, and shoulder pain. It is of note that the patient has a history of chronic pain. He was previously involved in a pain clinic. The patient states that he violated his contract by using a fentanyl patch that he bought off the street. It showed up in his random urine test, and the patient was dismissed from the pain clinic. The patient is to see Dr. Hinda Lenishamblias on March 2 and be referred to another pain management physician. The patient presents now for a refill of his pain medication. No other problems reported.  The history is provided by the patient.    Past Medical History  Diagnosis Date  . Obesity (BMI 30-39.9) 09/13/2011  . Cerebral palsy   . Thyroid disease   . Hypertension    History reviewed. No pertinent past surgical history. No family history on file. History  Substance Use Topics  . Smoking status: Current Every Day Smoker -- 1.00 packs/day    Types: Cigarettes  . Smokeless tobacco: Not on file  . Alcohol Use: No    Review of Systems  Constitutional: Negative for activity change.       All ROS Neg except as noted in HPI  HENT: Negative.   Eyes: Negative for photophobia and discharge.  Respiratory: Negative for cough, shortness of breath and wheezing.   Cardiovascular: Negative for chest pain and palpitations.  Gastrointestinal: Positive for vomiting. Negative for abdominal pain and blood in stool.  Genitourinary: Negative for dysuria, frequency and hematuria.  Musculoskeletal: Positive for back pain and arthralgias. Negative for neck pain.  Skin: Negative.   Neurological: Negative for dizziness,  seizures and speech difficulty.  Psychiatric/Behavioral: Negative for hallucinations and confusion.      Allergies  Penicillins  Home Medications   Prior to Admission medications   Medication Sig Start Date End Date Taking? Authorizing Provider  amLODipine (NORVASC) 10 MG tablet Take 1 tablet (10 mg total) by mouth daily. 06/05/14   Carney LivingMarshall L Chambliss, MD  baclofen (LIORESAL) 20 MG tablet take 1 tablet by mouth three times a day 07/30/14   Carney LivingMarshall L Chambliss, MD  carvedilol (COREG) 12.5 MG tablet Take 1 tablet (12.5 mg total) by mouth 2 (two) times daily with a meal. 01/28/14   Carney LivingMarshall L Chambliss, MD  hydrochlorothiazide (HYDRODIURIL) 25 MG tablet Take 1 tablet (25 mg total) by mouth daily. 06/05/14   Carney LivingMarshall L Chambliss, MD  HYDROcodone-acetaminophen (NORCO) 10-325 MG per tablet Take 1 tablet by mouth every 6 (six) hours as needed. 09/22/11   Carney LivingMarshall L Chambliss, MD  levothyroxine (SYNTHROID, LEVOTHROID) 100 MCG tablet Take 1 tablet (100 mcg total) by mouth daily. 06/05/14 08/29/15  Carney LivingMarshall L Chambliss, MD  lisinopril (PRINIVIL,ZESTRIL) 40 MG tablet Take 1 tablet (40 mg total) by mouth daily. 01/28/14   Carney LivingMarshall L Chambliss, MD  nicotine (NICOTROL) 10 MG inhaler Inhale 1 cartridge (1 continuous puffing total) into the lungs as needed for smoking cessation. 07/04/13   Carney LivingMarshall L Chambliss, MD  omeprazole (PRILOSEC) 20 MG capsule take 1 capsule by mouth once daily    Carney LivingMarshall L Chambliss, MD  potassium chloride (K-DUR) 10 MEQ tablet Take 2 tablets (20 mEq  total) by mouth daily. 09/12/14   Carney Living, MD  QUEtiapine (SEROQUEL) 50 MG tablet Take 1 tablet (50 mg total) by mouth at bedtime. 03/12/13   Carney Living, MD   BP 170/95 mmHg  Pulse 67  Temp(Src) 98.2 F (36.8 C) (Oral)  Resp 20  Ht  (1.6 m)  Wt 178 lb (80.74 kg)  BMI 31.54 kg/m2  SpO2 99% Physical Exam  Constitutional: He is oriented to person, place, and time. He appears well-developed and  well-nourished.  Non-toxic appearance.  HENT:  Head: Normocephalic.  Right Ear: Tympanic membrane and external ear normal.  Left Ear: Tympanic membrane and external ear normal.  Eyes: EOM and lids are normal. Pupils are equal, round, and reactive to light.  Neck: Normal range of motion. Neck supple. Carotid bruit is not present.  Cardiovascular: Normal rate, regular rhythm, normal heart sounds, intact distal pulses and normal pulses.   Pulmonary/Chest: Breath sounds normal. No respiratory distress.  Abdominal: Soft. Bowel sounds are normal. There is no tenderness. There is no guarding.  Musculoskeletal: Normal range of motion.  There is soreness with range of motion of the neck. There is no palpable step off of the cervical spine. No hot areas appreciated.  There is pain with attempted range of motion of the right shoulder. There no hot areas appreciated. There is full range of motion of the right elbow, wrist, and fingers. The capillary refill is less than 2 seconds. There is pain to change of position and attempted range of motion of the lumbar area. There is no palpable step off of the lumbar area.  Lymphadenopathy:       Head (right side): No submandibular adenopathy present.       Head (left side): No submandibular adenopathy present.    He has no cervical adenopathy.  Neurological: He is alert and oriented to person, place, and time. He has normal strength. No cranial nerve deficit or sensory deficit.  Gait is steady. Speech is understandable. No gross neurologic deficit appreciated at this time.  Skin: Skin is warm and dry.  Psychiatric: He has a normal mood and affect. His speech is normal.  Nursing note and vitals reviewed.   ED Course  Procedures (including critical care time) Labs Review Labs Reviewed - No data to display  Imaging Review No results found.   EKG Interpretation None      MDM  Patient presents to the emergency department with a request for refill of  his hydrocodone 10 mg. The patient states that he was discharged from the pain clinic because he used and off street fentanyl patch. He is scheduled to see his primary physician on March 2, and wants refill until that time. I have discussed with the patient that we are happy to evaluate him for any emergent situations. However that we are not set up for pain management. The patient is driving, so a prescription for 6 tablets of Norco will be given to the patient, with the instructions and information that he should see his primary physician for any additional pain management.    Final diagnoses:  None    *I have reviewed nursing notes, vital signs, and all appropriate lab and imaging results for this patient.[id number] PA-C 10/17/14 1508  Audree Camel, MD 10/18/14 [phone]

## 2014-10-23 ENCOUNTER — Ambulatory Visit (INDEPENDENT_AMBULATORY_CARE_PROVIDER_SITE_OTHER): Payer: 59 | Admitting: Family Medicine

## 2014-10-23 ENCOUNTER — Encounter: Payer: Self-pay | Admitting: Family Medicine

## 2014-10-23 VITALS — BP 168/111 | HR 67 | Temp 98.5°F | Wt 173.6 lb

## 2014-10-23 DIAGNOSIS — M5441 Lumbago with sciatica, right side: Secondary | ICD-10-CM

## 2014-10-23 DIAGNOSIS — I10 Essential (primary) hypertension: Secondary | ICD-10-CM | POA: Diagnosis not present

## 2014-10-23 DIAGNOSIS — M5442 Lumbago with sciatica, left side: Secondary | ICD-10-CM

## 2014-10-23 MED ORDER — OMEPRAZOLE 20 MG PO CPDR: 20.0000 mg | DELAYED_RELEASE_CAPSULE | Freq: Every day | ORAL | Status: DC

## 2014-10-23 NOTE — Assessment & Plan Note (Signed)
Not at goal due to not taking medications.  Discussed risks and how to take coreg twice daily  Follow up soon for recheck

## 2014-10-23 NOTE — Progress Notes (Signed)
   Subjective:    Patient ID: Melvin Nguyen, male    DOB: 20-Feb-1966, 49 y.o.   MRN: [id number]  HPI  Back Pain Worsened.  Interfering with activities.  He used an illicit fentanyl patch and was discharged from his pain center.  Has tried other medications in past and wants to get back on narcotics from a pain center.  No change in neuro status - weakness or incontinence.  HYPERTENSION Disease Monitoring Home BP Monitoring not checking regularly Chest pain- none recently    Dyspnea- no Medications Compliance-  Did not take any medications this AM forgot.  Usually takes.  Knows names and dosages except was taking coreg 2 tabs at once. Lightheadedness-  no  Edema- no ROS - See HPI  PMH Lab Review   POTASSIUM  Date Value Ref Range Status  10/03/2014 3.9 3.5 - 5.1 mEq/L Final   SODIUM  Date Value Ref Range Status  10/03/2014 140 135 - 145 mEq/L Final   CREAT  Date Value Ref Range Status  09/11/2014 0.85 0.50 - 1.35 mg/dL Final   CREATININE, SER  Date Value Ref Range Status  10/03/2014 1.01 0.40 - 1.50 mg/dL Final       Chief Complaint noted Review of Symptoms - see HPI PMH - Smoking status noted.   Vital Signs reviewed      Review of Systems     Objective:   Physical Exam Alert no acute distress Walks with normal atalgic gait Heart - Regular rate and rhythm.  No murmurs, gallops or rubs.    Lungs:  Normal respiratory effort, chest expands symmetrically. Lungs are clear to auscultation, no crackles or wheezes. No pedal edema       Assessment & Plan:

## 2014-10-23 NOTE — Assessment & Plan Note (Signed)
Worsened off narcotics.  Discussed how I believe narcotics are a poor treatment and recommend other avenues.  He wants pain center referral so will do.  Offered to discuss further as he would like

## 2014-10-23 NOTE — Patient Instructions (Signed)
Good to see you today!  Thanks for coming in.  I do not think that chronic narcotics are the best treatment for your pain - I will work with you to help manage it with other means  Take your blood pressure medications every day - The coreg needs to be taken around 12 hours apart twice daily  Come back in one month to check your blood pressure   Please bring all your medication bottles

## 2014-11-04 ENCOUNTER — Telehealth: Payer: Self-pay | Admitting: Family Medicine

## 2014-11-04 NOTE — Telephone Encounter (Signed)
Pt is calling because he said that Dr. Deirdre Priesthambliss said he would refer him to a pain center, pt is calling because he is wondering if that had been done or not / requested that PCP return his call if possible / sr

## 2014-11-04 NOTE — Telephone Encounter (Signed)
Dr. Deirdre Priesthambliss have you [place]. He is wanting to know the status of pain center referral. We cannot do anything until records have been received (we faxed for records to pain clinic when pt was here for referral). Rakwon Letourneau, CMA.

## 2014-11-05 NOTE — Telephone Encounter (Signed)
LM for patient on identified VM informing him that we are waiting on notes from his previous clinic before we can send notes to [facility].  Jazmin Hartsell,CMA

## 2014-11-05 NOTE — Telephone Encounter (Signed)
AS of today 3-15 I have not

## 2014-11-12 ENCOUNTER — Telehealth: Payer: Self-pay | Admitting: Family Medicine

## 2014-11-12 NOTE — Telephone Encounter (Signed)
Will forward to MD to see if he has received any records.  Frenchie Dangerfield,CMA

## 2014-11-12 NOTE — Telephone Encounter (Signed)
Patient states that he received a call today from [facility] for an appt 11/21/2014.  Dorene Bruni,CMA

## 2014-11-12 NOTE — Telephone Encounter (Signed)
No records have I

## 2014-11-12 NOTE — Telephone Encounter (Signed)
Pt is calling to check the status of his referral to pain management and also if his old pain clinic already sent his medical records. jw

## 2014-11-14 NOTE — Telephone Encounter (Signed)
[facility] are in my box Not sure what we are supposed to do with them? Please ask patient Thanks LC

## 2014-11-14 NOTE — Telephone Encounter (Signed)
Notes faxed to [facility] attn betty [phone]. Jazmin Hartsell,CMA

## 2014-11-21 DIAGNOSIS — M488X6 Other specified spondylopathies, lumbar region: Secondary | ICD-10-CM | POA: Diagnosis not present

## 2014-11-21 DIAGNOSIS — G894 Chronic pain syndrome: Secondary | ICD-10-CM | POA: Diagnosis not present

## 2014-11-21 DIAGNOSIS — M545 Low back pain: Secondary | ICD-10-CM | POA: Diagnosis not present

## 2014-11-21 DIAGNOSIS — Z79899 Other long term (current) drug therapy: Secondary | ICD-10-CM | POA: Diagnosis not present

## 2014-12-05 DIAGNOSIS — M488X6 Other specified spondylopathies, lumbar region: Secondary | ICD-10-CM | POA: Diagnosis not present

## 2014-12-05 DIAGNOSIS — Z79899 Other long term (current) drug therapy: Secondary | ICD-10-CM | POA: Diagnosis not present

## 2014-12-05 DIAGNOSIS — G894 Chronic pain syndrome: Secondary | ICD-10-CM | POA: Diagnosis not present

## 2014-12-19 DIAGNOSIS — M47817 Spondylosis without myelopathy or radiculopathy, lumbosacral region: Secondary | ICD-10-CM | POA: Diagnosis not present

## 2014-12-19 DIAGNOSIS — M5137 Other intervertebral disc degeneration, lumbosacral region: Secondary | ICD-10-CM | POA: Diagnosis not present

## 2014-12-19 DIAGNOSIS — Z79899 Other long term (current) drug therapy: Secondary | ICD-10-CM | POA: Diagnosis not present

## 2014-12-19 DIAGNOSIS — M545 Low back pain: Secondary | ICD-10-CM | POA: Diagnosis not present

## 2014-12-19 DIAGNOSIS — M488X6 Other specified spondylopathies, lumbar region: Secondary | ICD-10-CM | POA: Diagnosis not present

## 2014-12-19 DIAGNOSIS — G894 Chronic pain syndrome: Secondary | ICD-10-CM | POA: Diagnosis not present

## 2014-12-24 ENCOUNTER — Emergency Department (HOSPITAL_COMMUNITY): Payer: Medicare Other

## 2014-12-24 ENCOUNTER — Encounter (HOSPITAL_COMMUNITY): Payer: Self-pay | Admitting: *Deleted

## 2014-12-24 ENCOUNTER — Emergency Department (HOSPITAL_COMMUNITY)
Admission: EM | Admit: 2014-12-24 | Discharge: 2014-12-24 | Disposition: A | Discharge status: Home / Self Care | Payer: Medicare Other | Attending: Emergency Medicine | Admitting: Emergency Medicine

## 2014-12-24 DIAGNOSIS — Z79899 Other long term (current) drug therapy: Secondary | ICD-10-CM | POA: Insufficient documentation

## 2014-12-24 DIAGNOSIS — S8261XA Displaced fracture of lateral malleolus of right fibula, initial encounter for closed fracture: Secondary | ICD-10-CM | POA: Diagnosis not present

## 2014-12-24 DIAGNOSIS — S82831A Other fracture of upper and lower end of right fibula, initial encounter for closed fracture: Secondary | ICD-10-CM | POA: Diagnosis not present

## 2014-12-24 DIAGNOSIS — E669 Obesity, unspecified: Secondary | ICD-10-CM | POA: Diagnosis not present

## 2014-12-24 DIAGNOSIS — Y92009 Unspecified place in unspecified non-institutional (private) residence as the place of occurrence of the external cause: Secondary | ICD-10-CM | POA: Diagnosis not present

## 2014-12-24 DIAGNOSIS — Z8669 Personal history of other diseases of the nervous system and sense organs: Secondary | ICD-10-CM | POA: Insufficient documentation

## 2014-12-24 DIAGNOSIS — M1990 Unspecified osteoarthritis, unspecified site: Secondary | ICD-10-CM | POA: Diagnosis not present

## 2014-12-24 DIAGNOSIS — Y998 Other external cause status: Secondary | ICD-10-CM | POA: Diagnosis not present

## 2014-12-24 DIAGNOSIS — Z87891 Personal history of nicotine dependence: Secondary | ICD-10-CM | POA: Insufficient documentation

## 2014-12-24 DIAGNOSIS — E079 Disorder of thyroid, unspecified: Secondary | ICD-10-CM | POA: Insufficient documentation

## 2014-12-24 DIAGNOSIS — T1490XA Injury, unspecified, initial encounter: Secondary | ICD-10-CM

## 2014-12-24 DIAGNOSIS — S99911A Unspecified injury of right ankle, initial encounter: Secondary | ICD-10-CM | POA: Diagnosis present

## 2014-12-24 DIAGNOSIS — Y9301 Activity, walking, marching and hiking: Secondary | ICD-10-CM | POA: Diagnosis not present

## 2014-12-24 DIAGNOSIS — S82401A Unspecified fracture of shaft of right fibula, initial encounter for closed fracture: Secondary | ICD-10-CM

## 2014-12-24 DIAGNOSIS — Z880 Allergy status to penicillin: Secondary | ICD-10-CM | POA: Insufficient documentation

## 2014-12-24 DIAGNOSIS — W1849XA Other slipping, tripping and stumbling without falling, initial encounter: Secondary | ICD-10-CM | POA: Insufficient documentation

## 2014-12-24 HISTORY — DX: Unspecified osteoarthritis, unspecified site: M19.90

## 2014-12-24 MED ORDER — OXYCODONE-ACETAMINOPHEN 5-325 MG PO TABS: 1.0000 | ORAL_TABLET | Freq: Four times a day (QID) | ORAL | Status: DC | PRN

## 2014-12-24 MED ORDER — KETOROLAC TROMETHAMINE 10 MG PO TABS
10.0000 mg | ORAL_TABLET | Freq: Once | ORAL | Status: AC
  Administered 2014-12-24: 10 mg via ORAL
  Filled 2014-12-24: qty 1

## 2014-12-24 MED ORDER — ONDANSETRON HCL 4 MG PO TABS
4.0000 mg | ORAL_TABLET | Freq: Once | ORAL | Status: AC
  Administered 2014-12-24: 4 mg via ORAL
  Filled 2014-12-24: qty 1

## 2014-12-24 MED ORDER — DICLOFENAC SODIUM 75 MG PO TBEC: 75.0000 mg | DELAYED_RELEASE_TABLET | Freq: Two times a day (BID) | ORAL | Status: DC

## 2014-12-24 MED ORDER — OXYCODONE-ACETAMINOPHEN 5-325 MG PO TABS
1.0000 | ORAL_TABLET | Freq: Once | ORAL | Status: AC
  Administered 2014-12-24: 1 via ORAL
  Filled 2014-12-24: qty 1

## 2014-12-24 NOTE — Discharge Instructions (Signed)
You have a fracture of one of the bones in your ankle called the fibula. Please use the cam walker until seen by orthopedics. Please elevate her leg above your waist is much as possible. Please call Dr. Hilda LiasKeeling on tomorrow to set up an appointment as sone as possible. Please use diclofenac 2 times daily for inflammation and pain. May use Percocet for severe pain. Percocet may cause drowsiness, and/or constipation. Please use this medication with caution. Fibular Fracture, Ankle, Adult, Treated With or Without Immobilization A fibular fracture at your ankle is a break (fracture) bone in the smallest of the two bones in your lower leg, located on the outside of your leg (fibula) close to the area at your ankle joint. CAUSES  Rolling your ankle.  Twisting your ankle.  Extreme flexing or extending of your foot.  Severe force on your ankle as when falling from a distance. RISK FACTORS  Jumping activities.  Participation in sports.  Osteoporosis.  Advanced age.  Previous ankle injuries. SIGNS AND SYMPTOMS  Pain.  Swelling.  Inability to put weight on injured ankle.  Bruising.  Bone deformities at site of injury. DIAGNOSIS  This fracture is diagnosed with the help of an X-ray exam. TREATMENT  If the fractured bone did not move out of place it usually will heal without problems and does casting or splinting. If immobilization is needed for comfort or the fractured bone moved out of place and will not heal properly with immobilization, a cast or splint will be used. HOME CARE INSTRUCTIONS   Apply ice to the area of injury:  Put ice in a plastic bag.  Place a towel between your skin and the bag.  Leave the ice on for 20 minutes, 2-3 times a day.  Use crutches as directed. Resume walking without crutches as directed by your health care provider.  Only take over-the-counter or prescription medicines for pain, discomfort, or fever as directed by your health care provider.  If  you have a removable splint or boot, do not remove the boot unless directed by your health care provider. SEEK MEDICAL CARE IF:   You have continued pain or more swelling  The medications do not control the pain. SEEK IMMEDIATE MEDICAL CARE IF:  You develop severe pain in the leg or foot.  Your skin or nails below the injury turn blue or grey or feel cold or numb. MAKE SURE YOU:   Understand these instructions.  Will watch your condition.  Will get help right away if you are not doing well or get worse. Document Released: 08/09/2005 Document Revised: 05/30/2013 Document Reviewed: 03/21/2013 [facility] Patient Information 2015 [place], [place]. This information is not intended to replace advice given to you by your health care provider. Make sure you discuss any questions you have with your health care provider.

## 2014-12-24 NOTE — ED Provider Notes (Signed)
CSN: [id number]     Arrival date & time 12/24/14  1816 History   First MD Initiated Contact with Patient 12/24/14 2053     Chief Complaint  Patient presents with  . Ankle Pain     (Consider location/radiation/quality/duration/timing/severity/associated sxs/prior Treatment) HPI Comments: Patient is a 49 year old male who presents to the emergency department with complaint of pain involving the right ankle. The patient states that he was walking up a ramp at his home, he tripped and fell, and injured the right ankle. The patient states that during the course of the fall he felt a pop in his ankle, and he has not been able to put weight on it without severe pain since that time. The patient denies any other injury. The patient denies being on any anticoagulation medications. He has not had any previous operations or procedures involving the right ankle area. He tried Vicodin and elevation, but these were unsuccessful in resolving his pain.  Patient is a 49 y.o. male presenting with ankle pain. The history is provided by the patient.  Ankle Pain Location:  Ankle Ankle location:  R ankle   Past Medical History  Diagnosis Date  . Obesity (BMI 30-39.9) 09/13/2011  . Cerebral palsy   . Thyroid disease   . Hypertension   . Degenerative arthritis    History reviewed. No pertinent past surgical history. History reviewed. No pertinent family history. History  Substance Use Topics  . Smoking status: Former Smoker -- 1.00 packs/day    Types: Cigarettes  . Smokeless tobacco: Not on file  . Alcohol Use: No    Review of Systems  Musculoskeletal: Positive for arthralgias.  Neurological: Positive for tremors and weakness.  All other systems reviewed and are negative.     Allergies  Morphine and related and Penicillins  Home Medications   Prior to Admission medications   Medication Sig Start Date End Date Taking? Authorizing Provider  amLODipine (NORVASC) 10 MG tablet Take 1 tablet (10  mg total) by mouth daily. 06/05/14  Yes Carney Living, MD  baclofen (LIORESAL) 20 MG tablet take 1 tablet by mouth three times a day 07/30/14  Yes Carney Living, MD  carvedilol (COREG) 12.5 MG tablet Take 1 tablet (12.5 mg total) by mouth 2 (two) times daily with a meal. 01/28/14  Yes Carney Living, MD  EMBEDA 30-1.2 MG CPCR Take 1 capsule by mouth daily. 12/19/14  Yes Historical Provider, MD  hydrochlorothiazide (HYDRODIURIL) 25 MG tablet Take 1 tablet (25 mg total) by mouth daily. 06/05/14  Yes Carney Living, MD  HYDROcodone-acetaminophen (NORCO/VICODIN) 5-325 MG per tablet Take 1 tablet by mouth every 4 (four) hours as needed. Patient taking differently: Take 1 tablet by mouth every 4 (four) hours as needed for moderate pain.  10/17/14  Yes Ivery Quale, PA-C  levothyroxine (SYNTHROID, LEVOTHROID) 100 MCG tablet Take 1 tablet (100 mcg total) by mouth daily. 06/05/14 08/29/15 Yes Carney Living, MD  lisinopril (PRINIVIL,ZESTRIL) 40 MG tablet Take 1 tablet (40 mg total) by mouth daily. 01/28/14  Yes Carney Living, MD  omeprazole (PRILOSEC) 20 MG capsule Take 1 capsule (20 mg total) by mouth daily. 10/23/14  Yes Carney Living, MD  nicotine (NICOTROL) 10 MG inhaler Inhale 1 cartridge (1 continuous puffing total) into the lungs as needed for smoking cessation. Patient not taking: Reported on 12/24/2014 07/04/13   Carney Living, MD  potassium chloride (K-DUR) 10 MEQ tablet Take 2 tablets (20 mEq total) by mouth daily.  Patient not taking: Reported on 10/23/2014 09/12/14   Carney Living, MD  QUEtiapine (SEROQUEL) 50 MG tablet Take 1 tablet (50 mg total) by mouth at bedtime. Patient not taking: Reported on 10/23/2014 03/12/13   Carney Living, MD   BP 172/86 mmHg  Pulse 71  Temp(Src) 99.1 F (37.3 C) (Oral)  Resp 18  Ht  (1.6 m)  Wt 172 lb (78.019 kg)  BMI 30.48 kg/m2  SpO2 100% Physical Exam  Constitutional: He is oriented to person, place,  and time. He appears well-developed and well-nourished.  Non-toxic appearance.  HENT:  Head: Normocephalic.  Right Ear: Tympanic membrane and external ear normal.  Left Ear: Tympanic membrane and external ear normal.  Eyes: EOM and lids are normal. Pupils are equal, round, and reactive to light.  Neck: Normal range of motion. Neck supple. Carotid bruit is not present.  Cardiovascular: Normal rate, regular rhythm, normal heart sounds, intact distal pulses and normal pulses.   Pulmonary/Chest: Breath sounds normal. No respiratory distress.  Abdominal: Soft. Bowel sounds are normal. There is no tenderness. There is no guarding.  Musculoskeletal: Normal range of motion.  There is full range of motion of the right hip and knee. There is swelling and pain of the lateral more than medial malleolus on the right. The lateral malleolus is tender to palpation. The Achilles tendon is intact. Capillary refill is less than 2 seconds.  Lymphadenopathy:       Head (right side): No submandibular adenopathy present.       Head (left side): No submandibular adenopathy present.    He has no cervical adenopathy.  Neurological: He is alert and oriented to person, place, and time. He has normal strength. No cranial nerve deficit or sensory deficit.  Mild tremor noted. Patient has a history of cerebral palsy.  Skin: Skin is warm and dry.  Psychiatric: He has a normal mood and affect. His speech is normal.  Nursing note and vitals reviewed.   ED Course  FRACTURE CARE: Patient identified by arm band. Permission for the procedure is given by the patient. The patient sustained a fracture of the distal fibula after he stumbled and fell on a ramp at his home. Using the x-rays, the fracture was demonstrated to the patient, and the fracture as well as the procedure was explained to the patient in terms which he understood.  The patient was fitted with a cam walker. The patient was not fitted with crutches due to his  cerebral palsy. The patient is to follow-up with Dr. Hilda Lias. Prescription for Percocet one every 6 hours given to the patient.   Procedures (including critical care time) Labs Review Labs Reviewed - No data to display  Imaging Review Dg Ankle Complete Right  12/24/2014   CLINICAL DATA:  49 year old male with lateral right ankle pain after falling while walking up a ramp at his home  EXAM: RIGHT ANKLE - COMPLETE 3+ VIEW  COMPARISON:  None.  FINDINGS: Soft tissue swelling about the lateral malleolus. There is an obliquely oriented fracture at the level of the tibial plafond. The fracture is minimally displaced. The ankle mortise remains congruent and the talar dome is intact. No additional fracture or malalignment identified. No ankle joint effusion.  IMPRESSION: Acute minimally displaced fracture through the distal fibula at the level of the tibial plafond with associated soft tissue swelling.   Electronically Signed   By: Malachy Moan M.D.   On: 12/24/2014 20:00     EKG Interpretation  None      MDM  X-ray of the right ankle reveals an acute minimally displaced fracture through the distal fibula. The patient was advised of the fracture, and was fitted with a cam walker. He is advised to keep the foot elevated as much as possible. He will follow-up with Dr. Hilda LiasKeeling. Prescription for Percocet given to the patient.    Final diagnoses:  Fibula fracture, right, closed, initial encounter    *I have reviewed nursing notes, vital signs, and all appropriate lab and imaging results for this patient.Ivery Quale*    Alysse Rathe, PA-C 12/24/14 2158  Blane OharaJoshua Zavitz, MD 12/25/14 [phone]

## 2014-12-24 NOTE — ED Notes (Signed)
Pt verbalized understanding of no driving and to use caution within 4 hours of taking pain meds due to meds cause drowsiness 

## 2014-12-24 NOTE — ED Notes (Signed)
Pt reports tripping and twisting right ankle.  Reports feeling something pop.  States that he did take a Vicodin about 2 hours ago, and has had no relief.

## 2014-12-26 DIAGNOSIS — Z6831 Body mass index (BMI) 31.0-31.9, adult: Secondary | ICD-10-CM | POA: Diagnosis not present

## 2014-12-26 DIAGNOSIS — S82891A Other fracture of right lower leg, initial encounter for closed fracture: Secondary | ICD-10-CM | POA: Diagnosis not present

## 2014-12-26 DIAGNOSIS — I10 Essential (primary) hypertension: Secondary | ICD-10-CM | POA: Diagnosis not present

## 2014-12-26 DIAGNOSIS — Z720 Tobacco use: Secondary | ICD-10-CM | POA: Diagnosis not present

## 2014-12-31 DIAGNOSIS — G894 Chronic pain syndrome: Secondary | ICD-10-CM | POA: Diagnosis not present

## 2014-12-31 DIAGNOSIS — M488X6 Other specified spondylopathies, lumbar region: Secondary | ICD-10-CM | POA: Diagnosis not present

## 2014-12-31 DIAGNOSIS — M5137 Other intervertebral disc degeneration, lumbosacral region: Secondary | ICD-10-CM | POA: Diagnosis not present

## 2014-12-31 DIAGNOSIS — M47817 Spondylosis without myelopathy or radiculopathy, lumbosacral region: Secondary | ICD-10-CM | POA: Diagnosis not present

## 2014-12-31 DIAGNOSIS — Z79899 Other long term (current) drug therapy: Secondary | ICD-10-CM | POA: Diagnosis not present

## 2015-01-03 DIAGNOSIS — Z6831 Body mass index (BMI) 31.0-31.9, adult: Secondary | ICD-10-CM | POA: Diagnosis not present

## 2015-01-03 DIAGNOSIS — S82891A Other fracture of right lower leg, initial encounter for closed fracture: Secondary | ICD-10-CM | POA: Diagnosis not present

## 2015-01-16 DIAGNOSIS — M488X6 Other specified spondylopathies, lumbar region: Secondary | ICD-10-CM | POA: Diagnosis not present

## 2015-01-16 DIAGNOSIS — M545 Low back pain: Secondary | ICD-10-CM | POA: Diagnosis not present

## 2015-01-16 DIAGNOSIS — E669 Obesity, unspecified: Secondary | ICD-10-CM | POA: Diagnosis not present

## 2015-01-16 DIAGNOSIS — Z79899 Other long term (current) drug therapy: Secondary | ICD-10-CM | POA: Diagnosis not present

## 2015-01-16 DIAGNOSIS — M5137 Other intervertebral disc degeneration, lumbosacral region: Secondary | ICD-10-CM | POA: Diagnosis not present

## 2015-01-16 DIAGNOSIS — M47817 Spondylosis without myelopathy or radiculopathy, lumbosacral region: Secondary | ICD-10-CM | POA: Diagnosis not present

## 2015-01-16 DIAGNOSIS — G894 Chronic pain syndrome: Secondary | ICD-10-CM | POA: Diagnosis not present

## 2015-01-21 DIAGNOSIS — Z8719 Personal history of other diseases of the digestive system: Secondary | ICD-10-CM | POA: Diagnosis not present

## 2015-01-21 DIAGNOSIS — S82891D Other fracture of right lower leg, subsequent encounter for closed fracture with routine healing: Secondary | ICD-10-CM | POA: Diagnosis not present

## 2015-01-28 DIAGNOSIS — M5137 Other intervertebral disc degeneration, lumbosacral region: Secondary | ICD-10-CM | POA: Diagnosis not present

## 2015-01-28 DIAGNOSIS — G894 Chronic pain syndrome: Secondary | ICD-10-CM | POA: Diagnosis not present

## 2015-01-28 DIAGNOSIS — Z79899 Other long term (current) drug therapy: Secondary | ICD-10-CM | POA: Diagnosis not present

## 2015-02-05 DIAGNOSIS — S82891D Other fracture of right lower leg, subsequent encounter for closed fracture with routine healing: Secondary | ICD-10-CM | POA: Diagnosis not present

## 2015-02-05 DIAGNOSIS — Z8719 Personal history of other diseases of the digestive system: Secondary | ICD-10-CM | POA: Diagnosis not present

## 2015-02-13 DIAGNOSIS — M488X6 Other specified spondylopathies, lumbar region: Secondary | ICD-10-CM | POA: Diagnosis not present

## 2015-02-13 DIAGNOSIS — M47817 Spondylosis without myelopathy or radiculopathy, lumbosacral region: Secondary | ICD-10-CM | POA: Diagnosis not present

## 2015-02-13 DIAGNOSIS — M545 Low back pain: Secondary | ICD-10-CM | POA: Diagnosis not present

## 2015-02-13 DIAGNOSIS — G894 Chronic pain syndrome: Secondary | ICD-10-CM | POA: Diagnosis not present

## 2015-02-13 DIAGNOSIS — M5137 Other intervertebral disc degeneration, lumbosacral region: Secondary | ICD-10-CM | POA: Diagnosis not present

## 2015-02-13 DIAGNOSIS — Z79899 Other long term (current) drug therapy: Secondary | ICD-10-CM | POA: Diagnosis not present

## 2015-02-21 ENCOUNTER — Encounter (HOSPITAL_COMMUNITY): Payer: Self-pay

## 2015-02-21 ENCOUNTER — Ambulatory Visit (HOSPITAL_COMMUNITY): Payer: Medicare Other

## 2015-02-21 ENCOUNTER — Emergency Department (HOSPITAL_COMMUNITY): Payer: Medicare Other

## 2015-02-21 ENCOUNTER — Inpatient Hospital Stay (HOSPITAL_COMMUNITY)
Admission: EM | Admit: 2015-02-21 | Discharge: 2015-02-22 | DRG: 062 | Discharge status: Against Medical Advice | Payer: Medicare Other | Attending: Neurology | Admitting: Neurology

## 2015-02-21 DIAGNOSIS — Z87891 Personal history of nicotine dependence: Secondary | ICD-10-CM

## 2015-02-21 DIAGNOSIS — E1165 Type 2 diabetes mellitus with hyperglycemia: Secondary | ICD-10-CM | POA: Diagnosis not present

## 2015-02-21 DIAGNOSIS — I639 Cerebral infarction, unspecified: Secondary | ICD-10-CM | POA: Diagnosis not present

## 2015-02-21 DIAGNOSIS — I739 Peripheral vascular disease, unspecified: Secondary | ICD-10-CM | POA: Diagnosis not present

## 2015-02-21 DIAGNOSIS — E876 Hypokalemia: Secondary | ICD-10-CM

## 2015-02-21 DIAGNOSIS — E669 Obesity, unspecified: Secondary | ICD-10-CM | POA: Diagnosis present

## 2015-02-21 DIAGNOSIS — E785 Hyperlipidemia, unspecified: Secondary | ICD-10-CM | POA: Diagnosis not present

## 2015-02-21 DIAGNOSIS — R29818 Other symptoms and signs involving the nervous system: Secondary | ICD-10-CM | POA: Diagnosis not present

## 2015-02-21 DIAGNOSIS — Z79899 Other long term (current) drug therapy: Secondary | ICD-10-CM | POA: Diagnosis not present

## 2015-02-21 DIAGNOSIS — I6522 Occlusion and stenosis of left carotid artery: Secondary | ICD-10-CM | POA: Diagnosis not present

## 2015-02-21 DIAGNOSIS — R112 Nausea with vomiting, unspecified: Secondary | ICD-10-CM | POA: Diagnosis not present

## 2015-02-21 DIAGNOSIS — Z79891 Long term (current) use of opiate analgesic: Secondary | ICD-10-CM

## 2015-02-21 DIAGNOSIS — I10 Essential (primary) hypertension: Secondary | ICD-10-CM | POA: Diagnosis not present

## 2015-02-21 DIAGNOSIS — G8194 Hemiplegia, unspecified affecting left nondominant side: Secondary | ICD-10-CM | POA: Diagnosis present

## 2015-02-21 DIAGNOSIS — I6789 Other cerebrovascular disease: Secondary | ICD-10-CM | POA: Diagnosis not present

## 2015-02-21 DIAGNOSIS — G809 Cerebral palsy, unspecified: Secondary | ICD-10-CM | POA: Diagnosis not present

## 2015-02-21 DIAGNOSIS — Z885 Allergy status to narcotic agent status: Secondary | ICD-10-CM | POA: Diagnosis not present

## 2015-02-21 DIAGNOSIS — G919 Hydrocephalus, unspecified: Secondary | ICD-10-CM | POA: Diagnosis present

## 2015-02-21 DIAGNOSIS — Z880 Allergy status to penicillin: Secondary | ICD-10-CM

## 2015-02-21 LAB — RAPID URINE DRUG SCREEN, HOSP PERFORMED
Amphetamines: NOT DETECTED
BARBITURATES: NOT DETECTED
Benzodiazepines: NOT DETECTED
COCAINE: NOT DETECTED
Opiates: NOT DETECTED
Tetrahydrocannabinol: NOT DETECTED

## 2015-02-21 LAB — CBC
HCT: 42.6 % (ref 39.0–52.0)
Hemoglobin: 14.4 g/dL (ref 13.0–17.0)
MCH: 29.7 pg (ref 26.0–34.0)
MCHC: 33.8 g/dL (ref 30.0–36.0)
MCV: 87.8 fL (ref 78.0–100.0)
Platelets: 279 10*3/uL (ref 150–400)
RBC: 4.85 MIL/uL (ref 4.22–5.81)
RDW: 13.4 % (ref 11.5–15.5)
WBC: 10.5 10*3/uL (ref 4.0–10.5)

## 2015-02-21 LAB — LIPID PANEL
CHOL/HDL RATIO: 5 ratio
CHOLESTEROL: 195 mg/dL (ref 0–200)
HDL: 39 mg/dL — AB (ref >40)
LDL Cholesterol: 143 mg/dL — ABNORMAL HIGH (ref 0–99)
Triglycerides: 63 mg/dL (ref <150)
VLDL: 13 mg/dL (ref 0–40)

## 2015-02-21 LAB — I-STAT CHEM 8, ED
BUN: 15 mg/dL (ref 6–20)
CHLORIDE: 100 mmol/L — AB (ref 101–111)
CREATININE: 1 mg/dL (ref 0.61–1.24)
Calcium, Ion: 1.12 mmol/L (ref 1.12–1.23)
GLUCOSE: 174 mg/dL — AB (ref 65–99)
HCT: 45 % (ref 39.0–52.0)
HEMOGLOBIN: 15.3 g/dL (ref 13.0–17.0)
Potassium: 2.8 mmol/L — ABNORMAL LOW (ref 3.5–5.1)
Sodium: 141 mmol/L (ref 135–145)
TCO2: 25 mmol/L (ref 0–100)

## 2015-02-21 LAB — COMPREHENSIVE METABOLIC PANEL
ALT: 17 U/L (ref 17–63)
ANION GAP: 10 (ref 5–15)
AST: 16 U/L (ref 15–41)
Albumin: 4 g/dL (ref 3.5–5.0)
Alkaline Phosphatase: 51 U/L (ref 38–126)
BUN: 12 mg/dL (ref 6–20)
CHLORIDE: 102 mmol/L (ref 101–111)
CO2: 27 mmol/L (ref 22–32)
CREATININE: 1.06 mg/dL (ref 0.61–1.24)
Calcium: 9.1 mg/dL (ref 8.9–10.3)
GFR calc Af Amer: >60 mL/min (ref >60)
GLUCOSE: 175 mg/dL — AB (ref 65–99)
Potassium: 2.8 mmol/L — ABNORMAL LOW (ref 3.5–5.1)
SODIUM: 139 mmol/L (ref 135–145)
TOTAL PROTEIN: 6.9 g/dL (ref 6.5–8.1)
Total Bilirubin: 0.4 mg/dL (ref 0.3–1.2)

## 2015-02-21 LAB — DIFFERENTIAL
Basophils Absolute: 0 10*3/uL (ref 0.0–0.1)
Basophils Relative: 0 % (ref 0–1)
EOS ABS: 0.3 10*3/uL (ref 0.0–0.7)
Eosinophils Relative: 3 % (ref 0–5)
LYMPHS ABS: 2 10*3/uL (ref 0.7–4.0)
Lymphocytes Relative: 19 % (ref 12–46)
MONOS PCT: 8 % (ref 3–12)
Monocytes Absolute: 0.9 10*3/uL (ref 0.1–1.0)
Neutro Abs: 7.4 10*3/uL (ref 1.7–7.7)
Neutrophils Relative %: 70 % (ref 43–77)

## 2015-02-21 LAB — I-STAT TROPONIN, ED: Troponin i, poc: 0.01 ng/mL (ref 0.00–0.08)

## 2015-02-21 LAB — MRSA PCR SCREENING: MRSA BY PCR: NEGATIVE

## 2015-02-21 LAB — PROTIME-INR
INR: 1.05 (ref 0.00–1.49)
Prothrombin Time: 13.9 seconds (ref 11.6–15.2)

## 2015-02-21 LAB — APTT: aPTT: 27 seconds (ref 24–37)

## 2015-02-21 LAB — CBG MONITORING, ED: Glucose-Capillary: 145 mg/dL — ABNORMAL HIGH (ref 65–99)

## 2015-02-21 MED ORDER — HYDRALAZINE HCL 20 MG/ML IJ SOLN
10.0000 mg | INTRAMUSCULAR | Status: DC | PRN
  Administered 2015-02-21 (×2): 10 mg via INTRAVENOUS
  Filled 2015-02-21: qty 1

## 2015-02-21 MED ORDER — SENNOSIDES-DOCUSATE SODIUM 8.6-50 MG PO TABS
1.0000 | ORAL_TABLET | Freq: Every evening | ORAL | Status: DC | PRN
  Filled 2015-02-21: qty 1

## 2015-02-21 MED ORDER — IOHEXOL 350 MG/ML SOLN
80.0000 mL | Freq: Once | INTRAVENOUS | Status: AC | PRN
  Administered 2015-02-21: 80 mL via INTRAVENOUS

## 2015-02-21 MED ORDER — ACETAMINOPHEN 325 MG PO TABS: 650.0000 mg | ORAL_TABLET | ORAL | Status: DC | PRN

## 2015-02-21 MED ORDER — SODIUM CHLORIDE 0.9 % IV SOLN
50.0000 mL | Freq: Once | INTRAVENOUS | Status: AC
  Administered 2015-02-21: 50 mL via INTRAVENOUS

## 2015-02-21 MED ORDER — SODIUM CHLORIDE 0.9 % IV SOLN
INTRAVENOUS | Status: DC
  Administered 2015-02-21 (×2): via INTRAVENOUS

## 2015-02-21 MED ORDER — ALTEPLASE (STROKE) FULL DOSE INFUSION
0.9000 mg/kg | Freq: Once | INTRAVENOUS | Status: DC
  Filled 2015-02-21: qty 70

## 2015-02-21 MED ORDER — ATORVASTATIN CALCIUM 40 MG PO TABS
40.0000 mg | ORAL_TABLET | Freq: Every day | ORAL | Status: DC
  Administered 2015-02-21 – 2015-02-22 (×2): 40 mg via ORAL
  Filled 2015-02-21 (×3): qty 1

## 2015-02-21 MED ORDER — CETYLPYRIDINIUM CHLORIDE 0.05 % MT LIQD
7.0000 mL | Freq: Two times a day (BID) | OROMUCOSAL | Status: DC
  Administered 2015-02-21 – 2015-02-22 (×3): 7 mL via OROMUCOSAL

## 2015-02-21 MED ORDER — POTASSIUM CHLORIDE 10 MEQ/100ML IV SOLN
10.0000 meq | INTRAVENOUS | Status: AC
  Administered 2015-02-21 (×2): 10 meq via INTRAVENOUS
  Filled 2015-02-21: qty 100

## 2015-02-21 MED ORDER — ONDANSETRON HCL 4 MG/2ML IJ SOLN
4.0000 mg | Freq: Once | INTRAMUSCULAR | Status: DC
  Filled 2015-02-21: qty 2

## 2015-02-21 MED ORDER — SODIUM CHLORIDE 0.9 % IV SOLN: 50.0000 mL | Freq: Once | INTRAVENOUS | Status: DC

## 2015-02-21 MED ORDER — POTASSIUM CHLORIDE 10 MEQ/100ML IV SOLN
10.0000 meq | Freq: Once | INTRAVENOUS | Status: AC
  Administered 2015-02-21: 10 meq via INTRAVENOUS
  Filled 2015-02-21 (×2): qty 100

## 2015-02-21 MED ORDER — STROKE: EARLY STAGES OF RECOVERY BOOK
Freq: Once | Status: AC
  Administered 2015-02-21: 05:00:00
  Filled 2015-02-21: qty 1

## 2015-02-21 MED ORDER — ONDANSETRON HCL 4 MG/2ML IJ SOLN
4.0000 mg | Freq: Three times a day (TID) | INTRAMUSCULAR | Status: DC | PRN
  Administered 2015-02-21: 4 mg via INTRAVENOUS

## 2015-02-21 MED ORDER — HYDRALAZINE HCL 20 MG/ML IJ SOLN
INTRAMUSCULAR | Status: AC
  Administered 2015-02-21: 10 mg via INTRAVENOUS
  Filled 2015-02-21: qty 1

## 2015-02-21 MED ORDER — PANTOPRAZOLE SODIUM 40 MG IV SOLR
40.0000 mg | Freq: Every day | INTRAVENOUS | Status: DC
  Administered 2015-02-21: 40 mg via INTRAVENOUS
  Filled 2015-02-21 (×2): qty 40

## 2015-02-21 MED ORDER — ALTEPLASE (STROKE) FULL DOSE INFUSION
0.9000 mg/kg | Freq: Once | INTRAVENOUS | Status: AC
  Administered 2015-02-21: 61 mg via INTRAVENOUS
  Filled 2015-02-21: qty 61

## 2015-02-21 MED ORDER — ACETAMINOPHEN 650 MG RE SUPP: 650.0000 mg | RECTAL | Status: DC | PRN

## 2015-02-21 MED ORDER — HYDRALAZINE HCL 20 MG/ML IJ SOLN
INTRAMUSCULAR | Status: AC
  Filled 2015-02-21: qty 1

## 2015-02-21 NOTE — Progress Notes (Signed)
[id number] paged Neurology regarding mental status change. Pt lethargic and difficult to arouse. Pt still oriented. Spoke with Dr. Thad Rangereynolds. She stated patient has been lethargic and it is not a change. Will continue to monitor.

## 2015-02-21 NOTE — Progress Notes (Signed)
STROKE TEAM PROGRESS NOTE   HISTORY Melvin Nguyen is a 49 y.o. male with a history of CP and hypertension (unclear compliance with medications) who was riding in a car with a friend when he began to complain that he did not feel well. He then vomited, urinated on himself and was noted to have left sided weakness. His friend continued to ride for a few more minutes but when he did not improve they presented for evaluation. Patient was brought in through triage. Initial NIHSS of 13.   Date last known well: Date: 02/21/2015 Time last known well: Time: 01:30 tPA Given: Yes   SUBJECTIVE (INTERVAL HISTORY) His RN is at the bedside.  Overall he feels his condition is unchanged. His blood pressure has been adequately controlled overnight and he is neurologically stable   OBJECTIVE Temp:  [97.8 F (36.6 C)-98.5 F (36.9 C)] 98.3 F (36.8 C) (07/01 1145) Pulse Rate:  [52-67] 52 (07/01 1200) Cardiac Rhythm:  [-] Sinus bradycardia (07/01 0800) Resp:  [17-24] 20 (07/01 1200) BP: (136-195)/(77-101) 182/89 mmHg (07/01 1209) SpO2:  [98 %-100 %] 99 % (07/01 1200) Weight:  [68.3 kg (150 lb 9.2 oz)-72.5 kg (159 lb 13.3 oz)] 72.5 kg (159 lb 13.3 oz) (07/01 0600)   Recent Labs Lab 02/21/15 0206  GLUCAP 145*    Recent Labs Lab 02/21/15 0144 02/21/15 0148  NA 139 141  K 2.8* 2.8*  CL 102 100*  CO2 27  --   GLUCOSE 175* 174*  BUN 12 15  CREATININE 1.06 1.00  CALCIUM 9.1  --     Recent Labs Lab 02/21/15 0144  AST 16  ALT 17  ALKPHOS 51  BILITOT 0.4  PROT 6.9  ALBUMIN 4.0    Recent Labs Lab 02/21/15 0144 02/21/15 0148  WBC 10.5  --   NEUTROABS 7.4  --   HGB 14.4 15.3  HCT 42.6 45.0  MCV 87.8  --   PLT 279  --    No results for input(s): CKTOTAL, CKMB, CKMBINDEX, TROPONINI in the last 168 hours.  Recent Labs  02/21/15 0144  LABPROT 13.9  INR 1.05   No results for input(s): COLORURINE, LABSPEC, PHURINE, GLUCOSEU, HGBUR, BILIRUBINUR, KETONESUR, PROTEINUR,  UROBILINOGEN, NITRITE, LEUKOCYTESUR in the last 72 hours.  Invalid input(s): APPERANCEUR     Component Value Date/Time   CHOL 195 02/21/2015 0524   TRIG 63 02/21/2015 0524   HDL 39* 02/21/2015 0524   CHOLHDL 5.0 02/21/2015 0524   VLDL 13 02/21/2015 0524   LDLCALC 143* 02/21/2015 0524   No results found for: HGBA1C    Component Value Date/Time   LABOPIA NONE DETECTED 02/21/2015 0420   COCAINSCRNUR NONE DETECTED 02/21/2015 0420   LABBENZ NONE DETECTED 02/21/2015 0420   AMPHETMU NONE DETECTED 02/21/2015 0420   THCU NONE DETECTED 02/21/2015 0420   LABBARB NONE DETECTED 02/21/2015 0420    No results for input(s): ETH in the last 168 hours.     Ct Angio Head and Neck W/cm &/or Wo Cm 02/21/2015    1. No large vessel or proximal branch occlusion identified within the intracranial circulation.  2. No hemodynamically significant stenosis identified within the major arterial vasculature of the head and neck.     Ct Head (brain) Wo Contrast 02/21/2015    1. No intracranial hemorrhage.  Question early nonhemorrhagic posterior right parietal infarction versus mildly asymmetric small vessel ischemic disease.  2. Disproportionate ventricular enlargement, raising the question of normal pressure hydrocephalus.     MRI brain without  contrast - pending    CT head without contrast - pending     PHYSICAL EXAM Middle aged Caucasian male not in distress. . Afebrile. Head is nontraumatic. Neck is supple without bruit.    Cardiac exam no murmur or gallop. Lungs are clear to auscultation. Distal pulses are well felt.  Neurological Exam :  Awake alert oriented 2. Severely dysarthric speech but can be understood. Follows commands well. Extraocular movements are full range but slight right gaze preference. Pupils equal reactive. Fundi were not visualized. Vision acuity seems adequate. Blinks to threat more on the right than the left. Severe left lower facial weakness. Cough and gag weak. Tongue  midline. Motor system exam reveals left upper extremity plegia with 0/5 strength and left lower extremity 4/5 strength with weakness of hip flexors and ankle dorsiflexors. Slight increased tone on the right side with brisk reflexes Touch and pinprick sensation are preserved bilaterally. Left plantar is equivocal local right is downgoing. Coordination cannot be tested in the left arm and is normal elsewhere. Gait was not tested.     ASSESSMENT/PLAN [name] is a 49 y.o. male with history of obesity, cerebral palsy, and hypertension, presenting with vomiting, urinary incontinence, and left hemiparesis. He did receive IV t-PA 61 mg on 02/21/2015 at 0230.  Stroke:  Non-dominant nonhemorrhagic posterior right parietal infarct  Resultant  left hemiparesis  MRI  pending  MRA  not ordered ( refer to CTA head and neck )  Carotid Doppler  refer to CTA of the neck  CTA head and neck - unremarkable  2D Echo  pending  LDL 143  HgbA1c pending  SCDs for VTE prophylaxis Diet full liquid Room service appropriate?: Yes; Fluid consistency:: Thin  no antithrombotic prior to admission, now on no antithrombotic - secondary to TPA administration  Patient counseled to be compliant with his antithrombotic medications  Ongoing aggressive stroke risk factor management  Therapy recommendations:  Pending  Disposition: Pending  Hypertension  Home meds: Amlodipine and Coreg  Blood pressure mildly high - currently off antihypertensives medications.  Permissive hypertension <220/120 for 24-48 hours and then gradually normalize within 5-7 days  Patient counseled to be compliant with his blood pressure medications  Hyperlipidemia  Home meds: No lipid lowering medications prior to admission  LDL 143, goal < 70  Add Lipitor 40 mg daily  Continue statin at discharge  Diabetes  HgbA1c pending, goal < 7.0  Uncontrolled  Other Stroke Risk Factors  Cigarette smoker, quit  smoking.    Other Active Problems  Hypokalemia  Other Pertinent History      Hospital day # 0 He presented with left hemiplegia due to right brain infarct etiology to be determined. He received IV tPA but without significant improvement so far. He remains at risk for neurological worsening, TPA hemorrhage and needs close neurological monitoring and aggressive blood pressure control. Check MRI scan later today and if no hemorrhage. start Aspirin This patient is critically ill and at significant risk of neurological worsening, death and care requires constant monitoring of vital signs, hemodynamics,respiratory and cardiac monitoring, extensive review of multiple databases, frequent neurological assessment, discussion with family, other specialists and medical decision making of high complexity.I have made any additions or clarifications directly to the above note.This critical care time does not reflect procedure time, or teaching time or supervisory time of PA/NP/Med Resident etc but could involve care discussion time.  I spent 40 minutes of neurocritical care time  in the care of  this  patient.   Delia HeadyPramod Kayode Petion, MD       To contact Stroke Continuity provider, please refer to [email or web address]. After hours, contact General Neurology

## 2015-02-21 NOTE — Progress Notes (Signed)
Hydralazine PRN given

## 2015-02-21 NOTE — ED Notes (Signed)
CBG 145  

## 2015-02-21 NOTE — Evaluation (Signed)
Speech Language Pathology Evaluation Patient Details Name: Melvin ChingJames Nguyen MRN: [id number] DOB: Jan 08, 1966 Today's Date: 02/21/2015 Time: [id number] SLP Time Calculation (min) (ACUTE ONLY): 12 min  Problem List:  Patient Active Problem List   Diagnosis Date Noted  . Stroke 02/21/2015  . Hypokalemia 06/06/2014  . Back pain 11/29/2011  . Obesity (BMI 30-39.9) 09/13/2011  . 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 TOBACCO ABUSE 05/22/2009  . Essential hypertension 05/22/2009  . Hypothyroidism 02/13/2007  . ANXIETY DEPRESSION 02/13/2007  . CEREBRAL PALSY 02/13/2007  . SLEEP APNEA 02/13/2007   Past Medical History:  Past Medical History  Diagnosis Date  . Obesity (BMI 30-39.9) 09/13/2011  . Cerebral palsy   . Thyroid disease   . Hypertension   . Degenerative arthritis    Past Surgical History: No past surgical history on file. HPI:  49 y.o. male with a history of CP and hypertension (unclear compliance with medications) who was riding in a car with a friend when he began to complain that he did not feel well. Admitted  with acute onset left-sided weakness.  tPA administered.  CT negative.    Assessment / Plan / Recommendation Clinical Impression  Pt lethargic, with delayed initiation and response times.  Oriented, making needs known.  Speech mildly dysarthric with spastic vocal quality, not inconsistent with CP but baseline unknown. (Vocal function likely baseline). Sleepiness interfering with participation.  WIll follow briefly for further assessment, diagnostic tx.  D/W RN.    SLP Assessment  Patient needs continued Speech Lanaguage Pathology Services    Follow Up Recommendations   (tba)    Frequency and Duration min 2x/week  1 week   Pertinent Vitals/Pain Pain Assessment: No/denies pain   SLP Goals  Potential to Achieve Goals (ACUTE ONLY): Good  SLP Evaluation Prior Functioning  Cognitive/Linguistic Baseline: Information not available Type of Home: House  Lives  With:  (sister until a few months ago, now she is in SNF) Available Help at Discharge: Friend(s) (according to pt, friends can help)   Cognition  Overall Cognitive Status: No family/caregiver present to determine baseline cognitive functioning Arousal/Alertness: Lethargic Orientation Level: Oriented to person;Oriented to place;Oriented to time;Oriented to situation Attention: Sustained Sustained Attention: Appears intact Memory:  (tba - recalled name of nurse) Awareness: Appears intact Problem Solving:  (tba) Safety/Judgment:  (tba)    Comprehension  Auditory Comprehension Overall Auditory Comprehension: Appears within functional limits for tasks assessed Visual Recognition/Discrimination Discrimination: Within Function Limits Reading Comprehension Reading Status: Not tested    Expression Expression Primary Mode of Expression: Verbal Verbal Expression Overall Verbal Expression: Appears within functional limits for tasks assessed Initiation: Impaired Written Expression Dominant Hand: Right Written Expression: Not tested   Oral / Motor Oral Motor/Sensory Function Overall Oral Motor/Sensory Function: Impaired (asymmetry CN VII on left) Motor Speech Overall Motor Speech: Impaired at baseline Phonation:  (spasmodic quality) Resonance: Within functional limits Articulation: Impaired Level of Impairment: Conversation Intelligibility: Intelligibility reduced Word: 75-100% accurate Phrase: 75-100% accurate Sentence: 75-100% accurate Conversation: 75-100% accurate   Melvin Nguyen, [place] CCC/SLP Pager [phone]      Melvin Nguyen, Melvin Nguyen 02/21/2015, 11:45 AM

## 2015-02-21 NOTE — Evaluation (Signed)
Clinical/Bedside Swallow Evaluation Patient Details  Name: Melvin Nguyen MRN: [id number] Date of Birth: 14-Aug-1966  Today's Date: 02/21/2015 Time: SLP Start Time (ACUTE ONLY): 1105 SLP Stop Time (ACUTE ONLY): 1115 SLP Time Calculation (min) (ACUTE ONLY): 10 min  Past Medical History:  Past Medical History  Diagnosis Date  . Obesity (BMI 30-39.9) 09/13/2011  . Cerebral palsy   . Thyroid disease   . Hypertension   . Degenerative arthritis    Past Surgical History: No past surgical history on file. HPI:  49 y.o. male with a history of CP and hypertension (unclear compliance with medications) who was riding in a car with a friend when he began to complain that he did not feel well. Admitted  with acute onset left-sided weakness.  tPA administered.  CT negative.    Assessment / Plan / Recommendation Clinical Impression  Pt presents with functional oropharyngeal swallow,with mild pocketing left buccal cavity. Limited assessment of solids could be completed due to his refusals.  Lethargic, requiring frequent prompts to awaken during assessment.  No overt s/s of aspiration noted with thin liquids.  Recommend initiating a full liquid diet per pt's requests - does not want solids at this time.  Will follow briefly for safety/diet advancement.     Aspiration Risk  None    Diet Recommendation  (full liquid diet)   Medication Administration: Whole meds with liquid Compensations: Check for pocketing    Other  Recommendations     Follow Up Recommendations       Frequency and Duration min 1 x/week  1 week       SLP Swallow Goals     Swallow Study Prior Functional Status       General Date of Onset: 02/20/15 Other Pertinent Information: 49 y.o. male with a history of CP and hypertension (unclear compliance with medications) who was riding in a car with a friend when he began to complain that he did not feel well. Admitted  with acute onset left-sided weakness.  tPA administered.  CT  negative.  Type of Study: Bedside swallow evaluation Previous Swallow Assessment: no Diet Prior to this Study: NPO Temperature Spikes Noted: No Respiratory Status: Supplemental O2 delivered via (comment) ([place]) Behavior/Cognition: Alert Oral Cavity - Dentition: Adequate natural dentition/normal for age Self-Feeding Abilities: Able to feed self Patient Positioning: Upright in bed Baseline Vocal Quality:  (spasmodic quality) Volitional Cough: Strong Volitional Swallow: Able to elicit    Oral/Motor/Sensory Function Overall Oral Motor/Sensory Function: Impaired (asymmetry CN VII on left)   Ice Chips Ice chips: Within functional limits Presentation: Spoon   Thin Liquid Thin Liquid: Within functional limits Presentation: Cup;Straw    Nectar Thick Nectar Thick Liquid: Not tested   Honey Thick Honey Thick Liquid: Not tested   Puree Puree: mild pocketing left side Presentation: Spoon   Solid   Melvin Nguyen L. [place], [place] CCC/SLP Pager [phone]     Solid: Within functional limits (for single bolus - pt declined)       Blenda MountsCouture, Melvin Nguyen 02/21/2015,11:28 AM

## 2015-02-21 NOTE — H&P (Signed)
Admission H&P    Chief Complaint: Left sided weakness  HPI: Melvin Nguyen is an 49 y.o. male with a history of CP and hypertension (unclear compliance with medications) who was riding in a car with a friend when he began to complain that he di not feel well.  He then vomited, urinated on himself and was noted to have left sided weakness.  His friend continued to ride for a few more minutes but when he did not improve they presented for evaluation.  Patient was brought in through triage.  Initial NIHSS of 13.    Date last known well: Date: 02/21/2015 Time last known well: Time: 01:30 tPA Given: Yes  Past Medical History  Diagnosis Date  . Obesity (BMI 30-39.9) 09/13/2011  . Cerebral palsy   . Thyroid disease   . Hypertension   . Degenerative arthritis     No past surgical history on file.  Family history: Unable to obtain from friends. Patient has sister that is in a nursing home.   Social History:  reports that he has quit smoking. His smoking use included Cigarettes. He smoked 1.00 pack per day. He does not have any smokeless tobacco history on file. He reports that he does not drink alcohol or use illicit drugs.  Allergies:  Allergies  Allergen Reactions  . Morphine And Related Nausea And Vomiting  . Penicillins     REACTION: hives/itching    Medications: Current outpatient prescriptions:  .  amLODipine (NORVASC) 10 MG tablet, Take 1 tablet (10 mg total) by mouth daily., Disp: 90 tablet, Rfl: 2 .  baclofen (LIORESAL) 20 MG tablet, take 1 tablet by mouth three times a day, Disp: 90 tablet, Rfl: 3 .  carvedilol (COREG) 12.5 MG tablet, Take 1 tablet (12.5 mg total) by mouth 2 (two) times daily with a meal., Disp: 180 tablet, Rfl: 2 .  diclofenac (VOLTAREN) 75 MG EC tablet, Take 1 tablet (75 mg total) by mouth 2 (two) times daily., Disp: 14 tablet, Rfl: 0 .  EMBEDA 30-1.2 MG CPCR, Take 1 capsule by mouth daily., Disp: , Rfl:  .  hydrochlorothiazide (HYDRODIURIL) 25 MG tablet,  Take 1 tablet (25 mg total) by mouth daily., Disp: 90 tablet, Rfl: 2 .  HYDROcodone-acetaminophen (NORCO/VICODIN) 5-325 MG per tablet, Take 1 tablet by mouth every 4 (four) hours as needed. (Patient taking differently: Take 1 tablet by mouth every 4 (four) hours as needed for moderate pain. ), Disp: 6 tablet, Rfl: 0 .  levothyroxine (SYNTHROID, LEVOTHROID) 100 MCG tablet, Take 1 tablet (100 mcg total) by mouth daily., Disp: 90 tablet, Rfl: 2 .  lisinopril (PRINIVIL,ZESTRIL) 40 MG tablet, Take 1 tablet (40 mg total) by mouth daily., Disp: 90 tablet, Rfl: 2 .  nicotine (NICOTROL) 10 MG inhaler, Inhale 1 cartridge (1 continuous puffing total) into the lungs as needed for smoking cessation. (Patient not taking: Reported on 12/24/2014), Disp: 42 each, Rfl: 0 .  omeprazole (PRILOSEC) 20 MG capsule, Take 1 capsule (20 mg total) by mouth daily., Disp: 90 capsule, Rfl: 1 .  oxyCODONE-acetaminophen (PERCOCET/ROXICET) 5-325 MG per tablet, Take 1 tablet by mouth every 6 (six) hours as needed., Disp: 20 tablet, .  potassium chloride (K-DUR) 10 MEQ tablet, Take 2 tablets (20 mEq total) by mouth daily. (Patient not taking: Reported on 10/23/2014), Disp: 60 tablet, .  QUEtiapine (SEROQUEL) 50 MG tablet, Take 1 tablet (50 mg total) by mouth at bedtime. (Patient not taking: Reported on 10/23/2014), Disp: 30 tablet,  ROS: History obtained from  friends  General ROS: negative for - chills, fatigue, fever, night sweats, weight gain or weight loss Psychological ROS: negative for - behavioral disorder, hallucinations, memory difficulties, mood swings or suicidal ideation Ophthalmic ROS: negative for - blurry vision, double vision, eye pain or loss of vision ENT ROS: negative for - epistaxis, nasal discharge, oral lesions, sore throat, tinnitus or vertigo Allergy and Immunology ROS: negative for - hives or itchy/watery eyes Hematological and Lymphatic ROS: negative for - bleeding problems, bruising or swollen lymph  nodes Endocrine ROS: negative for - galactorrhea, hair pattern changes, polydipsia/polyuria or temperature intolerance Respiratory ROS: negative for - cough, hemoptysis, shortness of breath or wheezing Cardiovascular ROS: negative for - chest pain, dyspnea on exertion, edema or irregular heartbeat Gastrointestinal ROS: negative for - abdominal pain, diarrhea, hematemesis, nausea/vomiting or stool incontinence Genito-Urinary ROS: negative for - dysuria, hematuria, incontinence or urinary frequency/urgency Musculoskeletal ROS: back and knee pain Neurological ROS: as noted in HPI Dermatological ROS: negative for rash and skin lesion changes  Physical Examination: Blood pressure 166/85, pulse 60, temperature 97.8 F (36.6 C), temperature source Oral, resp. rate 24, height 5\' 3"  (1.6 m), weight 68.3 kg (150 lb 9.2 oz), SpO2 99 %.  General Examination:  HEENT-  Normocephalic, no lesions, without obvious abnormality.  Normal external eye and conjunctiva.  Normal TM's bilaterally.  Normal auditory canals and external ears. Normal external nose, mucus membranes and septum.  Normal pharynx. Cardiovascular- S1, S2 normal, pulses palpable throughout   Lungs- chest clear, no wheezing, rales, normal symmetric air entry Abdomen- soft, non-tender; bowel sounds normal; no masses,  no organomegaly Extremities- no edema Lymph-no adenopathy palpable Musculoskeletal-no joint tenderness Skin-warm and dry, no hyperpigmentation, vitiligo, or suspicious lesions  Neurological Examination Mental Status: Alert, oriented, thought content appropriate.  Speech fluent but dysarthric.  Able to follow 3 step commands without difficulty. Cranial Nerves: II: Discs flat bilaterally; Visual fields grossly normal, pupils equal, round, reactive to light and accommodation III,IV, VI: ptosis not present, extra-ocular motions intact bilaterally V,VII: left facial droop, facial light touch sensation normal bilaterally VIII:  hearing normal bilaterally IX,X: gag reflex present XI: decreased shoulder shrug on the left XII: midline tongue extension Motor: Right : Upper extremity   5-/5 with chronic flexion noted    Left:     Upper extremity   1/5  Lower extremity   5/5         Lower extremity   4-/5 Tone and bulk:normal tone throughout; no atrophy noted Sensory: Pinprick and light touch decreased on the left  Deep Tendon Reflexes: 2+ and symmetric throughout Plantars: Right: upgoing   Left: downgoing Cerebellar: normal finger-to-nose and normal heel-to-shin testing on the right Gait: not tested due to safety concerns   Laboratory Studies:   Basic Metabolic Panel:  Recent Labs Lab 02/21/15 0144 02/21/15 0148  NA 139 141  K 2.8* 2.8*  CL 102 100*  CO2 27  --   GLUCOSE 175* 174*  BUN 12 15  CREATININE 1.06 1.00  CALCIUM 9.1  --     Liver Function Tests:  Recent Labs Lab 02/21/15 0144  AST 16  ALT 17  ALKPHOS 51  BILITOT 0.4  PROT 6.9  ALBUMIN 4.0   No results for input(s): LIPASE, AMYLASE in the last 168 hours. No results for input(s): AMMONIA in the last 168 hours.  CBC:  Recent Labs Lab 02/21/15 0144 02/21/15 0148  WBC 10.5  --   NEUTROABS 7.4  --   HGB 14.4 15.3  HCT 42.6 45.0  MCV 87.8  --   PLT 279  --     Cardiac Enzymes: No results for input(s): CKTOTAL, CKMB, CKMBINDEX, TROPONINI in the last 168 hours.  BNP: Invalid input(s): POCBNP  CBG:  Recent Labs Lab 02/21/15 0206  GLUCAP 145*    Microbiology: Results for orders placed or performed during the hospital encounter of 03/12/09  Culture, blood (routine x 2)     Status: None   Collection Time: 03/12/09  2:50 PM  Result Value Ref Range Status   Specimen Description BLOOD LEFT ANTECUBITAL  Final   Special Requests BOTTLES DRAWN AEROBIC AND ANAEROBIC 4CC EACH  Final   Culture NO GROWTH 5 DAYS  Final   Report Status [id number] FINAL  Final  Culture, blood (routine x 2)     Status: None   Collection Time:  03/12/09  2:55 PM  Result Value Ref Range Status   Specimen Description BLOOD LEFT WRIST  Final   Special Requests BOTTLES DRAWN AEROBIC AND ANAEROBIC 4CC EACH  Final   Culture NO GROWTH 5 DAYS  Final   Report Status [id number] FINAL  Final    Coagulation Studies:  Recent Labs  02/21/15 0144  LABPROT 13.9  INR 1.05    Urinalysis: No results for input(s): COLORURINE, LABSPEC, PHURINE, GLUCOSEU, HGBUR, BILIRUBINUR, KETONESUR, PROTEINUR, UROBILINOGEN, NITRITE, LEUKOCYTESUR in the last 168 hours.  Invalid input(s): APPERANCEUR  Lipid Panel:     Component Value Date/Time   CHOL 223* 03/22/2011 1354   TRIG 146 03/22/2011 1354   HDL 37* 03/22/2011 1354   CHOLHDL 6.0 03/22/2011 1354   VLDL 29 03/22/2011 1354   LDLCALC 157* 03/22/2011 1354    HgbA1C: No results found for: HGBA1C  Urine Drug Screen:  No results found for: LABOPIA, COCAINSCRNUR, LABBENZ, AMPHETMU, THCU, LABBARB  Alcohol Level: No results for input(s): ETH in the last 168 hours.  Other results: EKG: sinus rhythm at 62 bpm, LVH.  Imaging: Ct Head (brain) Wo Contrast  02/21/2015   CLINICAL DATA:  Left-sided deficits.  Unknown when last seen normal.  EXAM: CT HEAD WITHOUT CONTRAST  TECHNIQUE: Contiguous axial images were obtained from the base of the skull through the vertex without intravenous contrast.  COMPARISON:  None.  FINDINGS: There is no intracranial hemorrhage or extra-axial fluid collection. There is subcortical white matter hypodensity in the posterior right parietal lobe, possibly an early nonhemorrhagic infarction follow there also is a lesser degree of subcortical hypodensity in the left posterior parietal lobe in this may merely represent mildly asymmetric chronic microvascular disease. There is ventriculomegaly, substantially disproportionate to the size of the sulci and sylvian fissures. This raises the question of normal pressure hydrocephalus. Brainstem and posterior fossa appear unremarkable.  IMPRESSION:  1. No intracranial hemorrhage. Question early nonhemorrhagic posterior right parietal infarction versus mildly asymmetric small vessel ischemic disease. 2. Disproportionate ventricular enlargement, raising the question of normal pressure hydrocephalus. These results were called by telephone at the time of interpretation on 02/21/2015 at 1:58 am to Dr. Dione Boozeavid Glick, who verbally acknowledged these results.   Electronically Signed   By: Ellery Plunkaniel R Mitchell M.D.   On: 02/21/2015 02:01    Assessment: 49 y.o. male presenting with acute onset left sided weakness.  Head CT personally reviewed and shows no evidence of hemorrhage.  Risks and benefits of tPA discussed with patient and friends.  Verbal consent obtained from patient.  Administration was delayed due to extreme emotionality on the part of friends requiring efforts to be placed  on controlling the situation.  BP controlled at that point.  tPA administered.  Patient to be admitted to ICU.  Will be evaluated for need of intervention as well.    Stroke Risk Factors - hypertension and smoking  Plan: 1. HgbA1c, fasting lipid panel 2. MRI of the brain without contrast 3. PT consult, OT consult, Speech consult 4. Echocardiogram 5. CTA of the head and neck 6. Prophylactic therapy-None 7. NPO until RN stroke swallow screen 8. Telemetry monitoring 9. Frequent neuro checks 10. Potassium replacement   This patient is critically ill and at significant risk of neurological worsening, death and care requires constant monitoring of vital signs, hemodynamics,respiratory and cardiac monitoring, neurological assessment, discussion with family, other specialists and medical decision making of high complexity. I spent 60 minutes of neurocritical care time  in the care of  this patient.  Thana Farr, MD [facility] [phone] 02/21/2015  3:13 AM

## 2015-02-21 NOTE — Progress Notes (Signed)
eLink Physician-Brief Progress Note Patient Name: Melvin ChingJames Dalal DOB: 07/15/1966 MRN: [id number]   Date of Service  02/21/2015  HPI/Events of Note  49 yo male with PMH of HTN, Obesity and Cerebral Palsy. Admitted to ICU with CVA. Treated with tPA. Management per Neurology. BP = 140/86, HR = 67. Sat = 99% and RR = 21.   eICU Interventions  Continue present management.      Intervention Category Evaluation Type: New Patient Evaluation  Lenell AntuSommer,Pierina Schuknecht Eugene 02/21/2015, 5:45 AM

## 2015-02-21 NOTE — Progress Notes (Signed)
Nurse called into room pt vomited. Pt complains of no headache. Assessment is the same. Notified MD for vomiting episode. Received orders for zofran and will continue to monitor pt.

## 2015-02-21 NOTE — ED Provider Notes (Signed)
CSN: [id number]     Arrival date & time 02/21/15  0134 History   This chart was scribed for Dione Boozeavid Kalise Fickett, MD by Arlan OrganAshley Leger, ED Scribe. This patient was seen in room A05C/A05C and the patient's care was started 1:49 AM.   Chief Complaint  Patient presents with  . Code Stroke   The history is provided by the patient. No language interpreter was used.    LEVEL 5 CAVEAT DUE TO CONDITION  HPI Comments: Melvin ChingJames Nguyen is a 49 y.o. male with a PMHx of cerebral palsy, HTN, and thyroid disease who presents to the Emergency Department here for code stroke this morning. Family states pt asked her to pull over while in the car as he felt sick. At that time, she reports an episode of vomiting, L sided weakness, nausea, along with slurred speech. No alcohol consumption or illicit drug use today. Last known normal 1 hour ago. Pt with known allergies to Penicillins and Morphine.  Past Medical History  Diagnosis Date  . Obesity (BMI 30-39.9) 09/13/2011  . Cerebral palsy   . Thyroid disease   . Hypertension   . Degenerative arthritis    No past surgical history on file. No family history on file. History  Substance Use Topics  . Smoking status: Former Smoker -- 1.00 packs/day    Types: Cigarettes  . Smokeless tobacco: Not on file  . Alcohol Use: No    Review of Systems  Unable to perform ROS: Other      Allergies  Morphine and related and Penicillins  Home Medications   Prior to Admission medications   Medication Sig Start Date End Date Taking? Authorizing Provider  amLODipine (NORVASC) 10 MG tablet Take 1 tablet (10 mg total) by mouth daily. 06/05/14   Carney LivingMarshall L Chambliss, MD  baclofen (LIORESAL) 20 MG tablet take 1 tablet by mouth three times a day 07/30/14   Carney LivingMarshall L Chambliss, MD  carvedilol (COREG) 12.5 MG tablet Take 1 tablet (12.5 mg total) by mouth 2 (two) times daily with a meal. 01/28/14   Carney LivingMarshall L Chambliss, MD  diclofenac (VOLTAREN) 75 MG EC tablet Take 1 tablet (75 mg  total) by mouth 2 (two) times daily. 12/24/14   Ivery QualeHobson Bryant, PA-C  EMBEDA 30-1.2 MG CPCR Take 1 capsule by mouth daily. 12/19/14   Historical Provider, MD  hydrochlorothiazide (HYDRODIURIL) 25 MG tablet Take 1 tablet (25 mg total) by mouth daily. 06/05/14   Carney LivingMarshall L Chambliss, MD  HYDROcodone-acetaminophen (NORCO/VICODIN) 5-325 MG per tablet Take 1 tablet by mouth every 4 (four) hours as needed. Patient taking differently: Take 1 tablet by mouth every 4 (four) hours as needed for moderate pain.  10/17/14   Ivery QualeHobson Bryant, PA-C  levothyroxine (SYNTHROID, LEVOTHROID) 100 MCG tablet Take 1 tablet (100 mcg total) by mouth daily. 06/05/14 08/29/15  Carney LivingMarshall L Chambliss, MD  lisinopril (PRINIVIL,ZESTRIL) 40 MG tablet Take 1 tablet (40 mg total) by mouth daily. 01/28/14   Carney LivingMarshall L Chambliss, MD  nicotine (NICOTROL) 10 MG inhaler Inhale 1 cartridge (1 continuous puffing total) into the lungs as needed for smoking cessation. Patient not taking: Reported on 12/24/2014 07/04/13   Carney LivingMarshall L Chambliss, MD  omeprazole (PRILOSEC) 20 MG capsule Take 1 capsule (20 mg total) by mouth daily. 10/23/14   Carney LivingMarshall L Chambliss, MD  oxyCODONE-acetaminophen (PERCOCET/ROXICET) 5-325 MG per tablet Take 1 tablet by mouth every 6 (six) hours as needed. 12/24/14   Ivery QualeHobson Bryant, PA-C  potassium chloride (K-DUR) 10 MEQ tablet Take 2 tablets (  20 mEq total) by mouth daily. Patient not taking: Reported on 10/23/2014 09/12/14   Carney Living, MD  QUEtiapine (SEROQUEL) 50 MG tablet Take 1 tablet (50 mg total) by mouth at bedtime. Patient not taking: Reported on 10/23/2014 03/12/13   Carney Living, MD   Triage Vitals: There were no vitals taken for this visit.   Physical Exam  Constitutional: He is oriented to person, place, and time. He appears well-developed and well-nourished.  HENT:  Head: Normocephalic and atraumatic.  Eyes: EOM are normal. Pupils are equal, round, and reactive to light.  Neck: Normal range of motion. Neck  supple. No JVD present. Carotid bruit is not present.  No carotid bruit  Cardiovascular: Normal rate, regular rhythm, normal heart sounds and intact distal pulses.   No murmur heard. Pulmonary/Chest: Effort normal and breath sounds normal. No respiratory distress. He has no wheezes. He has no rales.  Diffuse rhonchi  Abdominal: Soft. He exhibits no distension and no mass. There is no tenderness.  Musculoskeletal: Normal range of motion. He exhibits no edema.  Lymphadenopathy:    He has no cervical adenopathy.  Neurological: He is oriented to person, place, and time.  Moderate to severe L central facial droop Dense L hemiparesis with strength to L arm and L leg 2/5 Speech severely dysarthric   Skin: Skin is warm and dry. No rash noted.  Nursing note and vitals reviewed.   ED Course  Procedures (including critical care time)  DIAGNOSTIC STUDIES: Oxygen Saturation is 100% on room air, normal by my interpretation.    COORDINATION OF CARE: 1:49 AM- Will give Zofran. Will order CT head (brain) without contrast, i-stat chem 8, PT-INR, APTT, CBC, CMP, CBG, and EKG. Discussed treatment plan with pt at bedside and pt agreed to plan.     Labs Review Labs Reviewed  COMPREHENSIVE METABOLIC PANEL - Abnormal; Notable for the following:    Potassium 2.8 (*)    Glucose, Bld 175 (*)    All other components within normal limits  I-STAT CHEM 8, ED - Abnormal; Notable for the following:    Potassium 2.8 (*)    Chloride 100 (*)    Glucose, Bld 174 (*)    All other components within normal limits  CBG MONITORING, ED - Abnormal; Notable for the following:    Glucose-Capillary 145 (*)    All other components within normal limits  PROTIME-INR  APTT  CBC  DIFFERENTIAL  I-STAT TROPOININ, ED    Imaging Review Ct Head (brain) Wo Contrast  02/21/2015   CLINICAL DATA:  Left-sided deficits.  Unknown when last seen normal.  EXAM: CT HEAD WITHOUT CONTRAST  TECHNIQUE: Contiguous axial images were  obtained from the base of the skull through the vertex without intravenous contrast.  COMPARISON:  None.  FINDINGS: There is no intracranial hemorrhage or extra-axial fluid collection. There is subcortical white matter hypodensity in the posterior right parietal lobe, possibly an early nonhemorrhagic infarction follow there also is a lesser degree of subcortical hypodensity in the left posterior parietal lobe in this may merely represent mildly asymmetric chronic microvascular disease. There is ventriculomegaly, substantially disproportionate to the size of the sulci and sylvian fissures. This raises the question of normal pressure hydrocephalus. Brainstem and posterior fossa appear unremarkable.  IMPRESSION: 1. No intracranial hemorrhage. Question early nonhemorrhagic posterior right parietal infarction versus mildly asymmetric small vessel ischemic disease. 2. Disproportionate ventricular enlargement, raising the question of normal pressure hydrocephalus. These results were called by telephone at the time  of interpretation on 02/21/2015 at 1:58 am to Dr. Dione Booze, who verbally acknowledged these results.   Electronically Signed   By: Ellery Plunk M.D.   On: 02/21/2015 02:01   Images viewed by me, discussed with radiologist.   EKG Interpretation   Date/Time:  Friday February 21 2015 01:51:32 EDT Ventricular Rate:  62 PR Interval:  150 QRS Duration: 107 QT Interval:  461 QTC Calculation: 468 R Axis:   79 Text Interpretation:  Sinus rhythm Probable left ventricular hypertrophy  When compared with ECG of 09/11/2014, No significant change was found  Confirmed by [facility]  MD, Brittane Grudzinski ([id number]) on 02/21/2015 2:00:16 AM    CRITICAL CARE Performed by: UEAVW,[email or web address] Total critical care time: 40 minutes Critical care time was exclusive of separately billable procedures and treating other patients. Critical care was necessary to treat or prevent imminent or life-threatening deterioration. Critical care was time  spent personally by me on the following activities: development of treatment plan with patient and/or surrogate as well as nursing, discussions with consultants, evaluation of patient's response to treatment, examination of patient, obtaining history from patient or surrogate, ordering and performing treatments and interventions, ordering and review of laboratory studies, ordering and review of radiographic studies, pulse oximetry and re-evaluation of patient's condition.   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 CT shows no hemorrhage although incidental finding of a hydrocephalus is present. Patient is a good candidate for thrombolytic therapy. He is seen in conjunction with Dr. Thad Ranger of the neurology service who will be starting TPA. Workup is significant for hypokalemia and is given intravenous potassium. Mild hyperglycemia is also noted.  I personally performed the services described in this documentation, which was scribed in my presence. The recorded information has been reviewed and is accurate.    Dione Booze, MD 02/21/15 [phone]

## 2015-02-21 NOTE — Care Management Note (Signed)
Case Management Note  Patient Details  Name: Carman ChingJames Scarpulla MRN: [id number] Date of Birth: June 24, 1966  Subjective/Objective:     Pt admitted on 02/21/15 with stroke s/p TPA administration.  PTA, pt independent of ADLS.                 Action/Plan: Will follow for dc planning as pt progresses.    Expected Discharge Date:                  Expected Discharge Plan:  IP Rehab Facility  In-House Referral:     Discharge planning Services  CM Consult  Post Acute Care Choice:    Choice offered to:     DME Arranged:    DME Agency:     HH Arranged:    HH Agency:     Status of Service:  In process, will continue to follow  Medicare Important Message Given:    Date Medicare IM Given:    Medicare IM give by:    Date Additional Medicare IM Given:    Additional Medicare Important Message give by:     If discussed at Long Length of Stay Meetings, dates discussed:    Additional Comments:  Quintella BatonJulie W. Mariadelaluz [place], RN, BSN  Trauma/Neuro ICU Case Manager [phone]

## 2015-02-21 NOTE — Progress Notes (Addendum)
Code Stroke called on 49 y.o male. Per friend at bedside, she was driving with Pt when at [id number] he complained he did not feel well, vomited, voided on himself, and had left sided weakness. [facility] CT completed and Pt seen by EDP after CT scan. Code stroke called at 0200. CBG 145. Pertinent history includes HTN, Cerebal Palsy, obesity, and current smoker. NIHSS 13 initially with left sided weakness, left facial droop, dysarthria, and mild aphasia. TPA ordered and administered. BP increased momentarily due to severe emotional response of friends at bedside, once family situation settled BP controlled, visitation limited for Pt needs. Pt for CTA and admit to 3 M Neuro ICU.

## 2015-02-21 NOTE — Progress Notes (Signed)
  Echocardiogram 2D Echocardiogram has been performed.  Leta JunglingCooper, Katrisha Segall M 02/21/2015, 2:09 PM

## 2015-02-21 NOTE — ED Notes (Signed)
Pt arrives POV c/o facial droop, stroke s/s.

## 2015-02-22 ENCOUNTER — Inpatient Hospital Stay (HOSPITAL_COMMUNITY): Payer: Medicare Other

## 2015-02-22 LAB — HEMOGLOBIN A1C
Hgb A1c MFr Bld: 5.6 % (ref 4.8–5.6)
MEAN PLASMA GLUCOSE: 114 mg/dL

## 2015-02-22 MED ORDER — ASPIRIN EC 325 MG PO TBEC
325.0000 mg | DELAYED_RELEASE_TABLET | Freq: Every day | ORAL | Status: DC
  Administered 2015-02-22: 325 mg via ORAL
  Filled 2015-02-22: qty 1

## 2015-02-22 MED ORDER — HEPARIN SODIUM (PORCINE) 5000 UNIT/ML IJ SOLN
5000.0000 [IU] | Freq: Three times a day (TID) | INTRAMUSCULAR | Status: DC
  Administered 2015-02-22: 5000 [IU] via SUBCUTANEOUS

## 2015-02-22 MED ORDER — PNEUMOCOCCAL VAC POLYVALENT 25 MCG/0.5ML IJ INJ: 0.5000 mL | INJECTION | INTRAMUSCULAR | Status: DC

## 2015-02-22 MED ORDER — PANTOPRAZOLE SODIUM 40 MG PO TBEC
40.0000 mg | DELAYED_RELEASE_TABLET | Freq: Every day | ORAL | Status: DC
  Administered 2015-02-22: 40 mg via ORAL
  Filled 2015-02-22 (×2): qty 1

## 2015-02-22 NOTE — Evaluation (Signed)
Physical Therapy Evaluation Patient Details Name: Melvin ChingJames Lubeck MRN: [id number] DOB: [birth year] Today's Date: 02/22/2015   History of Present Illness  Melvin Nguyen is a 49 y.o. male with a history of CP and hypertension (unclear compliance with medications) who was riding in a car with a friend when he began to complain that he did not feel well. He then vomited, urinated on himself and was noted to have left sided weakness. MRI demod small acute ischemic infarct involving the right lentiform.  Clinical Impression  Pt functioning at independent PTA but now requires min/modA for safe transfers and ambulation. Pt motivated and demo's excellent rehab potential. Recommend CIR to achieve safe level of function for safe transition home.    Follow Up Recommendations CIR    Equipment Recommendations  None recommended by PT    Recommendations for Other Services Rehab consult     Precautions / Restrictions Precautions Precautions: Fall Restrictions Weight Bearing Restrictions: No      Mobility  Bed Mobility Overal bed mobility: Needs Assistance Bed Mobility: Supine to Sit     Supine to sit: Min assist     General bed mobility comments: v/c's for technique, increased time, labored effort  Transfers Overall transfer level: Needs assistance Equipment used: 1 person hand held assist Transfers: Sit to/from Stand Sit to Stand: Min assist         General transfer comment: v/c's to steady patient, pt slightly impulsive  Ambulation/Gait Ambulation/Gait assistance: Mod assist;+2 safety/equipment Ambulation Distance (Feet): 60 Feet Assistive device: 1 person hand held assist (2nd person for lines) Gait Pattern/deviations: Step-through pattern;Decreased stride length;Staggering left;Staggering right;Ataxic Gait velocity: decreased   General Gait Details: pt very unsteady requiring modA to maintain balance, pt with altered gait pattern,antalgic,occasional cross over gait  pattern  Stairs            Wheelchair Mobility    Modified Rankin (Stroke Patients Only) Modified Rankin (Stroke Patients Only) Pre-Morbid Rankin Score: Slight disability Modified Rankin: Moderately severe disability     Balance Overall balance assessment: Needs assistance         Standing balance support: Single extremity supported Standing balance-Leahy Scale: Poor Standing balance comment: needs physical assist to maintain balance                             Pertinent Vitals/Pain Pain Assessment: No/denies pain    Home Living Family/patient expects to be discharged to:: Private residence Living Arrangements: Alone Available Help at Discharge: Friend(s);Available PRN/intermittently Type of Home: House Home Access: Stairs to enter;Ramped entrance Entrance Stairs-Rails: Can reach both Entrance Stairs-Number of Steps: 3 Home Layout: One level Home Equipment: Walker - 2 wheels;Cane - single point;Shower seat Additional Comments: pt has 5 dogs    Prior Function Level of Independence: Independent         Comments: pt reports he does drive and do his grocery shopping     Hand Dominance   Dominant Hand: Right    Extremity/Trunk Assessment   Upper Extremity Assessment: RUE deficits/detail;LUE deficits/detail RUE Deficits / Details: altered tone from CP but functional     LUE Deficits / Details: altered tone from CP but functional     RLE Deficits / Details: altered tone from CP but functional LLE Deficits / Details: altered tone from CP but functional  Cervical / Trunk Assessment: Normal  Communication   Communication: No difficulties  Cognition Arousal/Alertness: Awake/alert Behavior During Therapy: WFL for tasks assessed/performed Overall Cognitive  Status: No family/caregiver present to determine baseline cognitive functioning                      General Comments      Exercises        Assessment/Plan    PT  Assessment Patient needs continued PT services  PT Diagnosis Difficulty walking;Generalized weakness   PT Problem List Decreased strength;Decreased range of motion;Decreased activity tolerance;Decreased balance;Decreased mobility;Decreased safety awareness  PT Treatment Interventions DME instruction;Gait training;Stair training;Functional mobility training;Therapeutic activities;Therapeutic exercise;Balance training;Neuromuscular re-education;Cognitive remediation   PT Goals (Current goals can be found in the Care Plan section) Acute Rehab PT Goals Patient Stated Goal: home to his dogs PT Goal Formulation: With patient Time For Goal Achievement: 03/01/15 Potential to Achieve Goals: Good    Frequency Min 4X/week   Barriers to discharge Decreased caregiver support lives alone, however reports he has friends that can come check on him    Co-evaluation               End of Session Equipment Utilized During Treatment: Gait belt Activity Tolerance: Patient tolerated treatment well Patient left: in chair;with call bell/phone within reach Nurse Communication: Mobility status         Time: 1610-9604 PT Time Calculation (min) (ACUTE ONLY): 18 min   Charges:   PT Evaluation $Initial PT Evaluation Tier I: 1 Procedure     PT G CodesMarcene Brawn 02/22/2015, 1:03 PM   Lewis Shock, PT, DPT Pager #: [phone] Office #: [phone]

## 2015-02-22 NOTE — Progress Notes (Addendum)
STROKE TEAM PROGRESS NOTE   HISTORY Melvin Nguyen is a 49 y.o. male with a history of CP and hypertension (unclear compliance with medications) who was riding in a car with a friend when he began to complain that he did not feel well. He then vomited, urinated on himself and was noted to have left sided weakness. His friend continued to ride for a few more minutes but when he did not improve they presented for evaluation. Patient was brought in through triage. Initial NIHSS of 13.     Date last known well: Date: 02/21/2015 Time last known well: Time: 01:30 tPA Given: Yes   SUBJECTIVE (INTERVAL HISTORY) Over 24 hrs post TPA. Exam much improved. Started ASA. Needs PT/OT   OBJECTIVE Temp:  [98.3 F (36.8 C)-99.1 F (37.3 C)] 99 F (37.2 C) (07/02 0400) Pulse Rate:  [52-81] 61 (07/02 0700) Cardiac Rhythm:  [-] Normal sinus rhythm;Sinus bradycardia (07/02 0800) Resp:  [17-27] 21 (07/02 0700) BP: (121-182)/(69-107) 139/74 mmHg (07/02 0700) SpO2:  [94 %-100 %] 95 % (07/02 0700)   Recent Labs Lab 02/21/15 0206  GLUCAP 145*    Recent Labs Lab 02/21/15 0144 02/21/15 0148  NA 139 141  K 2.8* 2.8*  CL 102 100*  CO2 27  --   GLUCOSE 175* 174*  BUN 12 15  CREATININE 1.06 1.00  CALCIUM 9.1  --     Recent Labs Lab 02/21/15 0144  AST 16  ALT 17  ALKPHOS 51  BILITOT 0.4  PROT 6.9  ALBUMIN 4.0    Recent Labs Lab 02/21/15 0144 02/21/15 0148  WBC 10.5  --   NEUTROABS 7.4  --   HGB 14.4 15.3  HCT 42.6 45.0  MCV 87.8  --   PLT 279  --    No results for input(s): CKTOTAL, CKMB, CKMBINDEX, TROPONINI in the last 168 hours.  Recent Labs  02/21/15 0144  LABPROT 13.9  INR 1.05   No results for input(s): COLORURINE, LABSPEC, PHURINE, GLUCOSEU, HGBUR, BILIRUBINUR, KETONESUR, PROTEINUR, UROBILINOGEN, NITRITE, LEUKOCYTESUR in the last 72 hours.  Invalid input(s): APPERANCEUR     Component Value Date/Time   CHOL 195 02/21/2015 0524   TRIG 63 02/21/2015 0524   HDL 39* 02/21/2015 0524   CHOLHDL 5.0 02/21/2015 0524   VLDL 13 02/21/2015 0524   LDLCALC 143* 02/21/2015 0524   Lab Results  Component Value Date   HGBA1C 5.6 02/21/2015      Component Value Date/Time   LABOPIA NONE DETECTED 02/21/2015 0420   COCAINSCRNUR NONE DETECTED 02/21/2015 0420   LABBENZ NONE DETECTED 02/21/2015 0420   AMPHETMU NONE DETECTED 02/21/2015 0420   THCU NONE DETECTED 02/21/2015 0420   LABBARB NONE DETECTED 02/21/2015 0420    No results for input(s): ETH in the last 168 hours.     Ct Angio Head and Neck W/cm &/or Wo Cm 02/21/2015    1. No large vessel or proximal branch occlusion identified within the intracranial circulation.  2. No hemodynamically significant stenosis identified within the major arterial vasculature of the head and neck.     Ct Head (brain) Wo Contrast 02/21/2015    1. No intracranial hemorrhage.  Question early nonhemorrhagic posterior right parietal infarction versus mildly asymmetric small vessel ischemic disease.  2. Disproportionate ventricular enlargement, raising the question of normal pressure hydrocephalus.     MRI brain without contrast Small acute ischemic infarct involving the right lentiform nucleus with superior extension into the periventricular white matter of the right corona radiata and/or body  of the right caudate      PHYSICAL EXAM Middle aged Caucasian male not in distress. . Afebrile. Head is nontraumatic. Neck is supple without bruit.    Cardiac exam no murmur or gallop. Lungs are clear to auscultation. Distal pulses are well felt.  Neurological Exam :  Awake alert oriented 2. Mild dysarthric speech which has improved.. Follows commands well. Extraocular movements are full range but slight right gaze preference. Pupils equal reactive. Fundi were not visualized. Vision acuity seems adequate. Blinks to threat more on the right than the left. Severe left lower facial weakness. Cough and gag weak. Tongue midline.  Motor system exam reveals left upper extremity drift with 4/5 strength and left lower extremity 4+/5 strength with weakness of hip flexors and ankle dorsiflexors. Slight increased tone on the right side with brisk reflexes Touch and pinprick sensation are preserved bilaterally. Left plantar is equivocal local right is downgoing.  Gait was not tested.     ASSESSMENT/PLAN [name] is a 49 y.o. male with history of obesity, cerebral palsy, and hypertension, presenting with vomiting, urinary incontinence, and left hemiparesis. He did receive IV t-PA 61 mg on 02/21/2015 at 0230.  Stroke:  Non-dominant nonhemorrhagic posterior right parietal infarct  Resultant  left hemiparesis  MRI as above     2D Echo with EF of 60-65%    LDL 143  HgbA1c pending  SCDs for VTE prophylaxis, Add heparin SQ Diet full liquid Room service appropriate?: Yes; Fluid consistency:: Thin  ASA started  Ongoing aggressive stroke risk factor management  Therapy recommendations:    Disposition: transfer to floor.    Pt/ot today  Hypertension  Home meds: Amlodipine and Coreg  Blood pressure mildly high - currently off antihypertensives medications.  Patient counseled to be compliant with his blood pressure medications  Hyperlipidemia  Home meds: No lipid lowering medications prior to admission  LDL 143, goal < 70  Add Lipitor 40 mg daily  Continue statin at discharge  Diabetes  HgbA1c pending, goal < 7.0  Uncontrolled  Other Stroke Risk Factors  Cigarette smoker, quit smoking.    Other Active Problems  Hypokalemia  Other Pertinent History      Hospital day # 1 He presented with left hemiplegia due to right brain infarct etiology to be determined. He received IV tPA but without significant improvement so far. He remains at risk for neurological worsening, TPA hemorrhage and needs close neurological monitoring and aggressive blood pressure control. Check MRI scan later  today and if no hemorrhage. start Aspirin This patient is critically ill and at significant risk of neurological worsening, death and care requires constant monitoring of vital signs, hemodynamics,respiratory and cardiac monitoring, extensive review of multiple databases, frequent neurological assessment, discussion with family, other specialists and medical decision making of high complexity.  Pauletta Browns        To contact Stroke Continuity provider, please refer to [email or web address]. After hours, contact General Neurology

## 2015-02-22 NOTE — Progress Notes (Signed)
Called by nursing.  Patient wishes to leave.  Patient s/p tPA administered on yesterday.  Evaluation per PT recommends patient to have a CIR admission.  Patient requiring assist for ambulation, transfers and bed mobility.  Patent advised to stay at least until tomorrow so that his caregivers can be given some training.  Patient refuses.  Reports that he has some dogs to take care of and that he has 24 hour care and will not stay.  Patient signed out AMA.  Thana FarrLeslie Ernan Runkles, MD [facility] [phone]

## 2015-02-22 NOTE — Progress Notes (Signed)
Pt signed AMA papers, Dr. Thad Rangereynolds talked with pt and still decided to leave.  Pt stated that he has 24/7 help at home. Pt removed both of his IVs.  Pt was taken by wheelchair to main entrance.

## 2015-03-03 DIAGNOSIS — M5137 Other intervertebral disc degeneration, lumbosacral region: Secondary | ICD-10-CM | POA: Diagnosis not present

## 2015-03-06 DIAGNOSIS — M1990 Unspecified osteoarthritis, unspecified site: Secondary | ICD-10-CM | POA: Diagnosis not present

## 2015-03-06 DIAGNOSIS — G894 Chronic pain syndrome: Secondary | ICD-10-CM | POA: Diagnosis not present

## 2015-03-06 DIAGNOSIS — Z79899 Other long term (current) drug therapy: Secondary | ICD-10-CM | POA: Diagnosis not present

## 2015-03-06 DIAGNOSIS — M79606 Pain in leg, unspecified: Secondary | ICD-10-CM | POA: Diagnosis not present

## 2015-03-06 DIAGNOSIS — M545 Low back pain: Secondary | ICD-10-CM | POA: Diagnosis not present

## 2015-03-13 DIAGNOSIS — M545 Low back pain: Secondary | ICD-10-CM | POA: Diagnosis not present

## 2015-03-13 DIAGNOSIS — M79609 Pain in unspecified limb: Secondary | ICD-10-CM | POA: Diagnosis not present

## 2015-03-18 NOTE — Discharge Summary (Signed)
Physician Discharge Summary  Patient ID: Melvin Nguyen MRN: [id number] DOB/AGE: 02/05/1966 49 y.o.  Admit date: 02/21/2015 Discharge date: 03/18/2015  Admission Diagnoses: Left sided weakness  Discharge Diagnoses: Right brain subcortical infarct likely due to small vessel disease Active Problems:   Stroke Left hemiparesis Hypertension Hyperlipidemia Diabetes Ex smoker  Discharged Condition: fair  Hospital Course: Melvin Nguyen is a 49 y.o. male with a history of CP and hypertension (unclear compliance with medications) who was riding in a car with a friend when he began to complain that he did not feel well. He then vomited, urinated on himself and was noted to have left sided weakness. His friend continued to ride for a few more minutes but when he did not improve they presented for evaluation. Patient was brought in through triage. Initial NIHSS of 13.  Date last known well: Date: 02/21/2015 Time last known well: Time: 01:30 tPA Given: Yes He was admitted to the intensive care unit. Blood pressure was tightly controlled and he had close neurological monitoring. His neurological exam was much improved in the lower extremities but he did have significant left upper extremity weakness.Marland Kitchen MRI scan showed a small acute infarct involving the right lentiform nucleus with superior extension into the periventricular white matter and corona radiata. 2-D echo showed normal ejection fraction. CT angiogram of the head and neck showed no large vessel occlusion or stenosis. He was seen by physical and occupational therapy and felt to be a good candidate for inpatient rehabilitation however the patient was adamant that he wanted to go home and look after his dogs and despite counseling by Dr. Thad Ranger and the nurses the patient refuses to stay in the hospital and left AGAINST MEDICAL ADVICE. He was advised to take aspirin for stroke prevention and maintain strict control of diabetes, hypertension and  lipids. He was advised to follow-up with his primary physician  Consults: none  Significant Diagnostic Studies:  Ct Angio Head and Neck W/cm &/or Wo Cm 02/21/2015  1. No large vessel or proximal branch occlusion identified within the intracranial circulation.  2. No hemodynamically significant stenosis identified within the major arterial vasculature of the head and neck.  Ct Head (brain) Wo Contrast 02/21/2015  1. No intracranial hemorrhage.  Question early nonhemorrhagic posterior right parietal infarction versus mildly asymmetric small vessel ischemic disease.  2. Disproportionate ventricular enlargement, raising the question of normal pressure hydrocephalus.  MRI brain without contrast Small acute ischemic infarct involving the right lentiform nucleus with superior extension into the periventricular white matter of the right corona radiata and/or body of the right caudate HbA1c 5.6 Lipids : LDL 143 total cholesterol 409 HDL 39 Discharge Exam: Blood pressure 169/105, pulse 64, temperature 98.1 F (36.7 C), temperature source Oral, resp. rate 20, height 5\' 3"  (1.6 m), weight 159 lb 13.3 oz (72.5 kg), SpO2 97 %. PHYSICAL EXAM Middle aged Caucasian male not in distress. . Afebrile. Head is nontraumatic. Neck is supple without bruit. Cardiac exam no murmur or gallop. Lungs are clear to auscultation. Distal pulses are well felt.  Neurological Exam :  Awake alert oriented 2. Mild dysarthric speech which has improved.. Follows commands well. Extraocular movements are full range but slight right gaze preference. Pupils equal reactive. Fundi were not visualized. Vision acuity seems adequate. Blinks to threat more on the right than the left. Severe left lower facial weakness. Cough and gag weak. Tongue midline. Motor system exam reveals left upper extremity drift with 4/5 strength and left lower extremity 4+/5 strength with  weakness of hip flexors and ankle dorsiflexors. Slight increased  tone on the right side with brisk reflexes Touch and pinprick sensation are preserved bilaterally. Left plantar is equivocal local right is downgoing. Gait was not tested.  Disposition: 07-Left Against Medical Advice    Home Discharge Medications   Medication List    ASK your doctor about these medications        amLODipine 10 MG tablet  Commonly known as:  NORVASC  Take 1 tablet (10 mg total) by mouth daily.     baclofen 20 MG tablet  Commonly known as:  LIORESAL  take 1 tablet by mouth three times a day     baclofen 10 MG tablet  Commonly known as:  LIORESAL  Take 10 mg by mouth 2 (two) times daily as needed for muscle spasms.     carvedilol 12.5 MG tablet  Commonly known as:  COREG  Take 1 tablet (12.5 mg total) by mouth 2 (two) times daily with a meal.     diclofenac 75 MG EC tablet  Commonly known as:  VOLTAREN  Take 1 tablet (75 mg total) by mouth 2 (two) times daily.     EMBEDA 30-1.2 MG Cpcr  Generic drug:  Morphine-Naltrexone  Take 1 capsule by mouth daily.     gabapentin 300 MG capsule  Commonly known as:  NEURONTIN  Take 300 mg by mouth 2 (two) times daily.     hydrochlorothiazide 25 MG tablet  Commonly known as:  HYDRODIURIL  Take 1 tablet (25 mg total) by mouth daily.     HYDROcodone-acetaminophen 5-325 MG per tablet  Commonly known as:  NORCO/VICODIN  Take 1 tablet by mouth every 4 (four) hours as needed.     levothyroxine 100 MCG tablet  Commonly known as:  SYNTHROID, LEVOTHROID  Take 1 tablet (100 mcg total) by mouth daily.     lisinopril 40 MG tablet  Commonly known as:  PRINIVIL,ZESTRIL  Take 1 tablet (40 mg total) by mouth daily.     nicotine 10 MG inhaler  Commonly known as:  NICOTROL  Inhale 1 cartridge (1 continuous puffing total) into the lungs as needed for smoking cessation.     omeprazole 20 MG capsule  Commonly known as:  PRILOSEC  Take 1 capsule (20 mg total) by mouth daily.     oxyCODONE-acetaminophen 5-325 MG per tablet   Commonly known as:  PERCOCET/ROXICET  Take 1 tablet by mouth every 6 (six) hours as needed.     potassium chloride 10 MEQ tablet  Commonly known as:  K-DUR  Take 2 tablets (20 mEq total) by mouth daily.     QUEtiapine 50 MG tablet  Commonly known as:  SEROQUEL  Take 1 tablet (50 mg total) by mouth at bedtime.        If d/c to Inpatient Rehab, Medications to to continued on Rehab     Signed: Ellee Wawrzyniak 03/18/2015, 1:53 PM

## 2015-03-19 ENCOUNTER — Ambulatory Visit: Payer: Medicare Other | Admitting: Family Medicine

## 2015-03-19 ENCOUNTER — Telehealth: Payer: Self-pay | Admitting: Family Medicine

## 2015-03-19 NOTE — Telephone Encounter (Signed)
Called to see how he was.  Missed appointment because did not have transportation  He is feeling well.  Not taking any antiplatelet agent  Asked him to make appointment asap  TAke an 81 mg asa daily

## 2015-03-26 ENCOUNTER — Encounter: Payer: Self-pay | Admitting: Family Medicine

## 2015-03-26 ENCOUNTER — Ambulatory Visit (INDEPENDENT_AMBULATORY_CARE_PROVIDER_SITE_OTHER): Payer: Medicare Other | Admitting: Family Medicine

## 2015-03-26 VITALS — BP 110/74 | HR 58 | Temp 98.4°F | Ht 63.0 in | Wt 160.2 lb

## 2015-03-26 DIAGNOSIS — I639 Cerebral infarction, unspecified: Secondary | ICD-10-CM

## 2015-03-26 DIAGNOSIS — E785 Hyperlipidemia, unspecified: Secondary | ICD-10-CM | POA: Diagnosis not present

## 2015-03-26 DIAGNOSIS — I10 Essential (primary) hypertension: Secondary | ICD-10-CM

## 2015-03-26 MED ORDER — ATORVASTATIN CALCIUM 40 MG PO TABS: 40.0000 mg | ORAL_TABLET | Freq: Every day | ORAL | Status: DC

## 2015-03-26 NOTE — Assessment & Plan Note (Signed)
BP Readings from Last 3 Encounters:  03/26/15 110/74  02/22/15 169/105  12/24/14 172/86   At goal continue current medications

## 2015-03-26 NOTE — Patient Instructions (Signed)
Good to see you today!  Thanks for coming in.  To prevent another stroke your need to: Take lipitor for cholesterol 40 mg every day Asprin 81 mg every day Your blood pressure medicines - keep your blood pressure < 140/90 No Smoking Regular exercise  Come back in 3 months   We will talk about colonoscopy later

## 2015-03-26 NOTE — Assessment & Plan Note (Signed)
Remarkable recovery.  Emphasized need for risk factor reduciton

## 2015-03-26 NOTE — Progress Notes (Signed)
   Subjective:    Patient ID: Melvin Nguyen, male    DOB: 1965-11-03, 49 y.o.   MRN: [id number]  HPI Follow up CVA - had TPA in hospital.  Feels he is back to normal from a weakness standpoint  HYPERTENSION Disease Monitoring: Blood pressure range-thinks has been good Chest pain, palpitations- no      Dyspnea- no Medications: Compliance- knows all blood pressure medications and names Lightheadedness,Syncope- no   Edema- no  CVA Did start taking asa daily.  Not on cholesterol medication.  No weakness or dysfunction other than his baseline cerebral palsy  HYPERLIPIDEMIA Has never been on cholesterol medication before.    Monitoring Labs and Parameters Last A1C:  Lab Results  Component Value Date   HGBA1C 5.6 02/21/2015    Last Lipid:     Component Value Date/Time   CHOL 195 02/21/2015 0524   HDL 39* 02/21/2015 0524    Last Bmet  POTASSIUM  Date Value Ref Range Status  02/21/2015 2.8* 3.5 - 5.1 mmol/L Final   SODIUM  Date Value Ref Range Status  02/21/2015 141 135 - 145 mmol/L Final   CREAT  Date Value Ref Range Status  09/11/2014 0.85 0.50 - 1.35 mg/dL Final   CREATININE, SER  Date Value Ref Range Status  02/21/2015 1.00 0.61 - 1.24 mg/dL Final      Last BPs:  BP Readings from Last 3 Encounters:  03/26/15 110/74  02/22/15 169/105  12/24/14 172/86    Chief Complaint noted Review of Symptoms - see HPI PMH - Smoking status noted.   Vital Signs reviewed     Review of Systems     Objective:   Physical Exam Heart - Regular rate and rhythm.  No murmurs, gallops or rubs.    Lungs:  Normal respiratory effort, chest expands symmetrically. Lungs are clear to auscultation, no crackles or wheezes. Normal ataxic gait and mild speech impediment       Assessment & Plan:

## 2015-03-26 NOTE — Assessment & Plan Note (Signed)
Given cad equivalent of CVA will start statin

## 2015-03-31 DIAGNOSIS — M545 Low back pain: Secondary | ICD-10-CM | POA: Diagnosis not present

## 2015-03-31 DIAGNOSIS — M79606 Pain in leg, unspecified: Secondary | ICD-10-CM | POA: Diagnosis not present

## 2015-03-31 DIAGNOSIS — Z79899 Other long term (current) drug therapy: Secondary | ICD-10-CM | POA: Diagnosis not present

## 2015-03-31 DIAGNOSIS — M1990 Unspecified osteoarthritis, unspecified site: Secondary | ICD-10-CM | POA: Diagnosis not present

## 2015-03-31 DIAGNOSIS — G894 Chronic pain syndrome: Secondary | ICD-10-CM | POA: Diagnosis not present

## 2015-03-31 DIAGNOSIS — M5137 Other intervertebral disc degeneration, lumbosacral region: Secondary | ICD-10-CM | POA: Diagnosis not present

## 2015-03-31 DIAGNOSIS — M488X6 Other specified spondylopathies, lumbar region: Secondary | ICD-10-CM | POA: Diagnosis not present

## 2015-03-31 DIAGNOSIS — M47817 Spondylosis without myelopathy or radiculopathy, lumbosacral region: Secondary | ICD-10-CM | POA: Diagnosis not present

## 2015-04-09 ENCOUNTER — Other Ambulatory Visit: Payer: Self-pay | Admitting: Family Medicine

## 2015-04-21 DIAGNOSIS — Z79899 Other long term (current) drug therapy: Secondary | ICD-10-CM | POA: Diagnosis not present

## 2015-04-21 DIAGNOSIS — G894 Chronic pain syndrome: Secondary | ICD-10-CM | POA: Diagnosis not present

## 2015-04-21 DIAGNOSIS — M25519 Pain in unspecified shoulder: Secondary | ICD-10-CM | POA: Diagnosis not present

## 2015-04-23 ENCOUNTER — Encounter (HOSPITAL_COMMUNITY): Payer: Self-pay | Admitting: *Deleted

## 2015-04-23 ENCOUNTER — Emergency Department (HOSPITAL_COMMUNITY)
Admission: EM | Admit: 2015-04-23 | Discharge: 2015-04-23 | Disposition: A | Discharge status: Home / Self Care | Payer: Medicare Other | Attending: Emergency Medicine | Admitting: Emergency Medicine

## 2015-04-23 DIAGNOSIS — M1990 Unspecified osteoarthritis, unspecified site: Secondary | ICD-10-CM | POA: Diagnosis not present

## 2015-04-23 DIAGNOSIS — Z79899 Other long term (current) drug therapy: Secondary | ICD-10-CM | POA: Diagnosis not present

## 2015-04-23 DIAGNOSIS — Z720 Tobacco use: Secondary | ICD-10-CM | POA: Diagnosis not present

## 2015-04-23 DIAGNOSIS — Z880 Allergy status to penicillin: Secondary | ICD-10-CM | POA: Insufficient documentation

## 2015-04-23 DIAGNOSIS — E079 Disorder of thyroid, unspecified: Secondary | ICD-10-CM | POA: Insufficient documentation

## 2015-04-23 DIAGNOSIS — E669 Obesity, unspecified: Secondary | ICD-10-CM | POA: Insufficient documentation

## 2015-04-23 DIAGNOSIS — Z8669 Personal history of other diseases of the nervous system and sense organs: Secondary | ICD-10-CM | POA: Diagnosis not present

## 2015-04-23 DIAGNOSIS — I10 Essential (primary) hypertension: Secondary | ICD-10-CM | POA: Diagnosis not present

## 2015-04-23 DIAGNOSIS — L02413 Cutaneous abscess of right upper limb: Secondary | ICD-10-CM | POA: Insufficient documentation

## 2015-04-23 DIAGNOSIS — Z7982 Long term (current) use of aspirin: Secondary | ICD-10-CM | POA: Diagnosis not present

## 2015-04-23 MED ORDER — DOXYCYCLINE HYCLATE 100 MG PO TABS
100.0000 mg | ORAL_TABLET | Freq: Once | ORAL | Status: AC
  Administered 2015-04-23: 100 mg via ORAL
  Filled 2015-04-23: qty 1

## 2015-04-23 MED ORDER — DOXYCYCLINE HYCLATE 100 MG PO CAPS: 100.0000 mg | ORAL_CAPSULE | Freq: Two times a day (BID) | ORAL | Status: DC

## 2015-04-23 MED ORDER — IBUPROFEN 800 MG PO TABS
800.0000 mg | ORAL_TABLET | Freq: Once | ORAL | Status: AC
  Administered 2015-04-23: 800 mg via ORAL
  Filled 2015-04-23: qty 1

## 2015-04-23 MED ORDER — IBUPROFEN 800 MG PO TABS: 800.0000 mg | ORAL_TABLET | Freq: Three times a day (TID) | ORAL | Status: DC

## 2015-04-23 NOTE — ED Notes (Signed)
Pt states, "I don't know if it is a boil or insect bite but it had a head on it and I popped it and  squeezed out some stuff about 15 minutes ago. "

## 2015-04-23 NOTE — ED Provider Notes (Signed)
CSN: [id number]     Arrival date & time 04/23/15  1345 History   First MD Initiated Contact with Patient 04/23/15 1446     Chief Complaint  Patient presents with  . Abscess     (Consider location/radiation/quality/duration/timing/severity/associated sxs/prior Treatment) HPI Comments: Patient not sure if something bit him, or if he scratched the area and it got infected. The patient has a red raised area with a scab of the right forearm. There no red streaks that the patient recalls seeing. He has not had any high fever reported. He states that he does not have any immune system compromise is that he is aware of.  The history is provided by the patient.    Past Medical History  Diagnosis Date  . Obesity (BMI 30-39.9) 09/13/2011  . Cerebral palsy   . Thyroid disease   . Hypertension   . Degenerative arthritis    Past Surgical History  Procedure Laterality Date  . Tooth extraction     No family history on file. Social History  Substance Use Topics  . Smoking status: Current Every Day Smoker -- 1.00 packs/day    Types: Cigarettes  . Smokeless tobacco: None  . Alcohol Use: No    Review of Systems  Musculoskeletal: Positive for arthralgias.  Skin: Positive for wound.  All other systems reviewed and are negative.     Allergies  Morphine and related and Penicillins  Home Medications   Prior to Admission medications   Medication Sig Start Date End Date Taking? Authorizing Provider  amLODipine (NORVASC) 10 MG tablet Take 1 tablet (10 mg total) by mouth daily. 06/05/14  Yes Carney Living, MD  aspirin 81 MG tablet Take 81 mg by mouth daily.   Yes Historical Provider, MD  atorvastatin (LIPITOR) 40 MG tablet Take 1 tablet (40 mg total) by mouth daily. 03/26/15  Yes Carney Living, MD  baclofen (LIORESAL) 20 MG tablet take 1 tablet three times a day 04/10/15  Yes Carney Living, MD  carvedilol (COREG) 12.5 MG tablet Take 1 tablet (12.5 mg total) by mouth 2 (two)  times daily with a meal. 01/28/14  Yes Carney Living, MD  hydrochlorothiazide (HYDRODIURIL) 25 MG tablet Take 1 tablet (25 mg total) by mouth daily. 06/05/14  Yes Carney Living, MD  levothyroxine (SYNTHROID, LEVOTHROID) 100 MCG tablet Take 1 tablet (100 mcg total) by mouth daily. 06/05/14 08/29/15 Yes Carney Living, MD  lisinopril (PRINIVIL,ZESTRIL) 40 MG tablet Take 1 tablet (40 mg total) by mouth daily. 01/28/14  Yes Carney Living, MD  Multiple Vitamin (MULTIVITAMIN) tablet Take 1 tablet by mouth daily.   Yes Historical Provider, MD  omeprazole (PRILOSEC) 20 MG capsule Take 1 capsule (20 mg total) by mouth daily. 10/23/14  Yes Carney Living, MD  oxyCODONE-acetaminophen (PERCOCET/ROXICET) 5-325 MG per tablet Take 1 tablet by mouth every 6 (six) hours as needed. 12/24/14  Yes Ivery Quale, PA-C  potassium chloride (K-DUR) 10 MEQ tablet Take 2 tablets (20 mEq total) by mouth daily. 09/12/14  Yes Carney Living, MD  doxycycline (VIBRAMYCIN) 100 MG capsule Take 1 capsule (100 mg total) by mouth 2 (two) times daily. 04/23/15   Ivery Quale, PA-C  ibuprofen (ADVIL,MOTRIN) 800 MG tablet Take 1 tablet (800 mg total) by mouth 3 (three) times daily. 04/23/15   Ivery Quale, PA-C   BP 131/78 mmHg  Pulse 61  Temp(Src) 98.2 F (36.8 C) (Oral)  Resp 18  Ht 5\' 3"  (1.6 m)  Wt  148 lb (67.132 kg)  BMI 26.22 kg/m2  SpO2 100% Physical Exam  Constitutional: He is oriented to person, place, and time. He appears well-developed and well-nourished.  Non-toxic appearance.  HENT:  Head: Normocephalic.  Right Ear: Tympanic membrane and external ear normal.  Left Ear: Tympanic membrane and external ear normal.  Eyes: EOM and lids are normal. Pupils are equal, round, and reactive to light.  Neck: Normal range of motion. Neck supple. Carotid bruit is not present.  Cardiovascular: Normal rate, regular rhythm, normal heart sounds, intact distal pulses and normal pulses.   Pulmonary/Chest:  Breath sounds normal. No respiratory distress.  Abdominal: Soft. Bowel sounds are normal. There is no tenderness. There is no guarding.  Musculoskeletal: Normal range of motion.       Right forearm: He exhibits tenderness. He exhibits no edema and no laceration.       Arms: Lymphadenopathy:       Head (right side): No submandibular adenopathy present.       Head (left side): No submandibular adenopathy present.    He has no cervical adenopathy.  Neurological: He is alert and oriented to person, place, and time. He has normal strength. No cranial nerve deficit or sensory deficit.  Skin: Skin is warm and dry.  Psychiatric: He has a normal mood and affect. His speech is normal.  Nursing note and vitals reviewed.   ED Course  Procedures (including critical care time) Labs Review Labs Reviewed - No data to display  Imaging Review No results found. I have personally reviewed and evaluated these images and lab results as part of my medical decision-making.   EKG Interpretation None      MDM  The vital signs reviewed. Discussed with patient the need to soak the wound area and salt water. Discussed use of anti-biotic as the patient has an infected area of the forearm. There no red streaks noted. Patient placed on doxycycline 2 times daily. He is to follow-up with his primary physician, or return to the emergency department if not improving.    Final diagnoses:  Abscess of forearm, right    *I have reviewed nursing notes, vital signs, and all appropriate lab and imaging results for this patient.[address], PA-C 04/23/15 1545  Geoffery Lyons, MD 04/24/15 [phone]

## 2015-04-23 NOTE — Discharge Instructions (Signed)
Abscess °An abscess (boil or furuncle) is an infected area on or under the skin. This area is filled with yellowish-white fluid (pus) and other material (debris). °HOME CARE  °· Only take medicines as told by your doctor. °· If you were given antibiotic medicine, take it as directed. Finish the medicine even if you start to feel better. °· If gauze is used, follow your doctor's directions for changing the gauze. °· To avoid spreading the infection: °¨ Keep your abscess covered with a bandage. °¨ Wash your hands well. °¨ Do not share personal care items, towels, or whirlpools with others. °¨ Avoid skin contact with others. °· Keep your skin and clothes clean around the abscess. °· Keep all doctor visits as told. °GET HELP RIGHT AWAY IF:  °· You have more pain, puffiness (swelling), or redness in the wound site. °· You have more fluid or blood coming from the wound site. °· You have muscle aches, chills, or you feel sick. °· You have a fever. °MAKE SURE YOU:  °· Understand these instructions. °· Will watch your condition. °· Will get help right away if you are not doing well or get worse. °Document Released: 01/26/2008 Document Revised: 02/08/2012 Document Reviewed: 10/22/2011 °[employer]® Patient Information ©2015 [employer]. This information is not intended to replace advice given to you by your health care provider. Make sure you discuss any questions you have with your health care provider. ° °

## 2015-04-29 DIAGNOSIS — M79606 Pain in leg, unspecified: Secondary | ICD-10-CM | POA: Diagnosis not present

## 2015-04-29 DIAGNOSIS — M545 Low back pain: Secondary | ICD-10-CM | POA: Diagnosis not present

## 2015-04-29 DIAGNOSIS — M5137 Other intervertebral disc degeneration, lumbosacral region: Secondary | ICD-10-CM | POA: Diagnosis not present

## 2015-04-30 ENCOUNTER — Other Ambulatory Visit: Payer: Self-pay | Admitting: Pain Medicine

## 2015-04-30 DIAGNOSIS — M545 Low back pain: Secondary | ICD-10-CM

## 2015-05-02 DIAGNOSIS — Z79899 Other long term (current) drug therapy: Secondary | ICD-10-CM | POA: Diagnosis not present

## 2015-05-02 DIAGNOSIS — G894 Chronic pain syndrome: Secondary | ICD-10-CM | POA: Diagnosis not present

## 2015-05-10 ENCOUNTER — Ambulatory Visit
Admission: RE | Admit: 2015-05-10 | Discharge: 2015-05-10 | Disposition: A | Discharge status: Home / Self Care | Payer: Medicare Other | Source: Ambulatory Visit | Attending: Pain Medicine | Admitting: Pain Medicine

## 2015-05-10 DIAGNOSIS — M47816 Spondylosis without myelopathy or radiculopathy, lumbar region: Secondary | ICD-10-CM | POA: Diagnosis not present

## 2015-05-10 DIAGNOSIS — M5126 Other intervertebral disc displacement, lumbar region: Secondary | ICD-10-CM | POA: Diagnosis not present

## 2015-05-10 DIAGNOSIS — M545 Low back pain: Secondary | ICD-10-CM

## 2015-05-12 ENCOUNTER — Telehealth: Payer: Self-pay | Admitting: Family Medicine

## 2015-05-12 NOTE — Telephone Encounter (Signed)
Will forward to PCP for review. Adams,Latoya, CMA. 

## 2015-05-12 NOTE — Telephone Encounter (Signed)
Pt called to make an appointment with Dr. Deirdre Priest next week. The next available appointment in 10/12. The patient is leaving and not sure if he is coming back and would like to speak to the doctor before he leaves. jw

## 2015-05-13 NOTE — Telephone Encounter (Signed)
Spoke w him  He is moving to [facility].   He will request refills as needed

## 2015-05-21 ENCOUNTER — Ambulatory Visit (INDEPENDENT_AMBULATORY_CARE_PROVIDER_SITE_OTHER): Payer: Medicare Other | Admitting: Family Medicine

## 2015-05-21 ENCOUNTER — Encounter: Payer: Self-pay | Admitting: Family Medicine

## 2015-05-21 VITALS — BP 138/91 | HR 65 | Temp 98.2°F | Wt 156.0 lb

## 2015-05-21 DIAGNOSIS — M5442 Lumbago with sciatica, left side: Secondary | ICD-10-CM

## 2015-05-21 DIAGNOSIS — M5441 Lumbago with sciatica, right side: Secondary | ICD-10-CM | POA: Diagnosis not present

## 2015-05-21 MED ORDER — TRAMADOL HCL 50 MG PO TABS: 50.0000 mg | ORAL_TABLET | Freq: Three times a day (TID) | ORAL | Status: DC | PRN

## 2015-05-21 NOTE — Assessment & Plan Note (Signed)
Did not fill patient's percocet today as prescribed given numerous red flags, including recently being discharged from his pain clinic. Per database, patient has had 4 refills of percocets filled over the past 3 months, each for 120 pills. Gave a short supply of tramadol. No red flag signs or symptoms. Will follow up as needed.

## 2015-05-21 NOTE — Progress Notes (Signed)
    Subjective:  Melvin Nguyen is a 49 y.o. male who presents to the [facility] today with a chief complaint of chronic back pain.   HPI:  Chronic Back Pain. Patient presents with chronic back pain. Patient states that he was previously managed at a pain clinic, however was recently discharged. Patient states that he was called in for a pill count and did not have the right number of pills. States that he dropped his pill bottle at home and "about 40" of his pills fell through a hole in the floor. Reports that he has been without pain medications for the past 4-5 days. Per database, patient has had 4 refills of percocets filled over the past 3 months, each for 120 pills. Patient reports that he is moving to [place] next week and needs enough pain medications to last him until he finds a new doctor.   Denies fevers or chills. No bowel or bladder incontinence. No saddle anesthesia.  ROS: Per HPI   Objective:  Physical Exam: BP 138/91 mmHg  Pulse 65  Temp(Src) 98.2 F (36.8 C) (Oral)  Wt 156 lb (70.761 kg)  Gen: NAD, resting comfortably GI: Normal bowel sounds present. Soft, Nontender, Nondistended. MSK: no edema, cyanosis, or clubbing noted, walks with a cane Skin: warm, dry Neuro: No focal deficits.  Psych: Normal affect and thought content   Assessment/Plan:  No problem-specific assessment & plan notes found for this encounter.   Katina Degree. Jimmey Ralph, MD [facility] Family Medicine Resident PGY-2 05/21/2015 10:38 AM

## 2015-05-21 NOTE — Patient Instructions (Signed)
We gave you a prescription for tramadol today. We also gave you a copy of your MRI report.  Take care,  Dr Jimmey Ralph

## 2015-06-02 ENCOUNTER — Emergency Department (HOSPITAL_COMMUNITY)
Admission: EM | Admit: 2015-06-02 | Discharge: 2015-06-02 | Disposition: A | Discharge status: Home / Self Care | Payer: Medicare Other | Attending: Emergency Medicine | Admitting: Emergency Medicine

## 2015-06-02 ENCOUNTER — Encounter (HOSPITAL_COMMUNITY): Payer: Self-pay | Admitting: *Deleted

## 2015-06-02 DIAGNOSIS — M25551 Pain in right hip: Secondary | ICD-10-CM | POA: Insufficient documentation

## 2015-06-02 DIAGNOSIS — I10 Essential (primary) hypertension: Secondary | ICD-10-CM | POA: Insufficient documentation

## 2015-06-02 DIAGNOSIS — G8929 Other chronic pain: Secondary | ICD-10-CM | POA: Diagnosis not present

## 2015-06-02 DIAGNOSIS — Z7982 Long term (current) use of aspirin: Secondary | ICD-10-CM | POA: Diagnosis not present

## 2015-06-02 DIAGNOSIS — Z79899 Other long term (current) drug therapy: Secondary | ICD-10-CM | POA: Diagnosis not present

## 2015-06-02 DIAGNOSIS — E079 Disorder of thyroid, unspecified: Secondary | ICD-10-CM | POA: Insufficient documentation

## 2015-06-02 DIAGNOSIS — Z880 Allergy status to penicillin: Secondary | ICD-10-CM | POA: Diagnosis not present

## 2015-06-02 DIAGNOSIS — E669 Obesity, unspecified: Secondary | ICD-10-CM | POA: Diagnosis not present

## 2015-06-02 DIAGNOSIS — Z720 Tobacco use: Secondary | ICD-10-CM | POA: Diagnosis not present

## 2015-06-02 DIAGNOSIS — M545 Low back pain: Secondary | ICD-10-CM | POA: Diagnosis not present

## 2015-06-02 NOTE — Discharge Instructions (Signed)
Chronic Pain  Chronic pain can be defined as pain that is off and on and lasts for 3-6 months or longer. Many things cause chronic pain, which can make it difficult to make a diagnosis. There are many treatment options available for chronic pain. However, finding a treatment that works well for you may require trying various approaches until the right one is found. Many people benefit from a combination of two or more types of treatment to control their pain.  SYMPTOMS   Chronic pain can occur anywhere in the body and can range from mild to very severe. Some types of chronic pain include:  · Headache.  · Low back pain.  · Cancer pain.  · Arthritis pain.  · Neurogenic pain. This is pain resulting from damage to nerves.   People with chronic pain may also have other symptoms such as:  · Depression.  · Anger.  · Insomnia.  · Anxiety.  DIAGNOSIS   Your health care provider will help diagnose your condition over time. In many cases, the initial focus will be on excluding possible conditions that could be causing the pain. Depending on your symptoms, your health care provider may order tests to diagnose your condition. Some of these tests may include:   · Blood tests.    · CT scan.    · MRI.    · X-rays.    · Ultrasounds.    · Nerve conduction studies.    You may need to see a specialist.   TREATMENT   Finding treatment that works well may take time. You may be referred to a pain specialist. He or she may prescribe medicine or therapies, such as:   · Mindful meditation or yoga.  · Shots (injections) of numbing or pain-relieving medicines into the spine or area of pain.  · Local electrical stimulation.  · Acupuncture.    · Massage therapy.    · Aroma, color, light, or sound therapy.    · Biofeedback.    · Working with a physical therapist to keep from getting stiff.    · Regular, gentle exercise.    · Cognitive or behavioral therapy.    · Group support.    Sometimes, surgery may be recommended.   HOME CARE INSTRUCTIONS    · Take all medicines as directed by your health care provider.    · Lessen stress in your life by relaxing and doing things such as listening to calming music.    · Exercise or be active as directed by your health care provider.    · Eat a healthy diet and include things such as vegetables, fruits, fish, and lean meats in your diet.    · Keep all follow-up appointments with your health care provider.    · Attend a support group with others suffering from chronic pain.  SEEK MEDICAL CARE IF:   · Your pain gets worse.    · You develop a new pain that was not there before.    · You cannot tolerate medicines given to you by your health care provider.    · You have new symptoms since your last visit with your health care provider.    SEEK IMMEDIATE MEDICAL CARE IF:   · You feel weak.    · You have decreased sensation or numbness.    · You lose control of bowel or bladder function.    · Your pain suddenly gets much worse.    · You develop shaking.  · You develop chills.  · You develop confusion.  · You develop chest pain.  · You develop shortness of breath.    MAKE SURE YOU:  ·   Document Revised: 04/11/2013 Document Reviewed: 02/02/2013 Elsevier Interactive Patient Education 2016 Elsevier Inc. Chronic Pain Discharge Instructions  Emergency care providers appreciate that many patients coming to [place] are in severe pain and we wish to address their pain in the safest, most responsible manner.  It is important to recognize however, that the proper treatment of chronic pain differs from that of the pain of injuries and acute illnesses.  Our goal is to provide quality, safe, personalized care  and we thank you for giving [place] the opportunity to serve you. The use of narcotics and related agents for chronic pain syndromes may lead to additional physical and psychological problems.  Nearly as many people die from prescription narcotics each year as die from car crashes.  Additionally, this risk is increased if such prescriptions are obtained from a variety of sources.  Therefore, only your primary care physician or a pain management specialist is able to safely treat such syndromes with narcotic medications long-term.    Documentation revealing such prescriptions have been sought from multiple sources may prohibit [place] from providing a refill or different narcotic medication.  Your name may be checked first through the [facility] Controlled Substances Reporting System.  This database is a record of controlled substance medication prescriptions that the patient has received.  This has been established by [facility] in an effort to eliminate the dangerous, and often life threatening, practice of obtaining multiple prescriptions from different medical providers.   If you have a chronic pain syndrome (i.e. chronic headaches, recurrent back or neck pain, dental pain, abdominal or pelvis pain without a specific diagnosis, or neuropathic pain such as fibromyalgia) or recurrent visits for the same condition without an acute diagnosis, you may be treated with non-narcotics and other non-addictive medicines.  Allergic reactions or negative side effects that may be reported by a patient to such medications will not typically lead to the use of a narcotic analgesic or other controlled substance as an alternative.   Patients managing chronic pain with a personal physician should have provisions in place for breakthrough pain.  If you are in crisis, you should call your physician.  If your physician directs you to the emergency department, please have the doctor call and speak to our attending physician  concerning your care.   When patients come to the Emergency Department (ED) with acute medical conditions in which the Emergency Department physician feels appropriate to prescribe narcotic or sedating pain medication, the physician will prescribe these in very limited quantities.  The amount of these medications will last only until you can see your primary care physician in his/her office.  Any patient who returns to the ED seeking refills should expect only non-narcotic pain medications.   In the event of an acute medical condition exists and the emergency physician feels it is necessary that the patient be given a narcotic or sedating medication -  a responsible adult driver should be present in the room prior to the medication being given by the nurse.   Prescriptions for narcotic or sedating medications that have been lost, stolen or expired will not be refilled in the Emergency Department.    Patients who have chronic pain may receive non-narcotic prescriptions until seen by their primary care physician.  It is every patients personal responsibility to maintain active prescriptions with his or her primary care physician or specialist.  Emergency Department Resource Guide 1) Find a Doctor and Pay Out of Pocket Although you won't have to find out  who is covered by your insurance plan, it is a good idea to ask around and get recommendations. You will then need to call the office and see if the doctor you have chosen will accept you as a new patient and what types of options they offer for patients who are self-pay. Some doctors offer discounts or will set up payment plans for their patients who do not have insurance, but you will need to ask so you aren't surprised when you get to your appointment.  2) Contact Your Local Health Department Not all health departments have doctors that can see patients for sick visits, but many do, so it is worth a call to see if yours does. If you don't know where  your local health department is, you can check in your phone book. The CDC also has a tool to help you locate your state's health department, and many state websites also have listings of all of their local health departments.  3) Find a Walk-in Clinic If your illness is not likely to be very severe or complicated, you may want to try a walk in clinic. These are popping up all over the country in pharmacies, drugstores, and shopping centers. They're usually staffed by nurse practitioners or physician assistants that have been trained to treat common illnesses and complaints. They're usually fairly quick and inexpensive. However, if you have serious medical issues or chronic medical problems, these are probably not your best option.  No Primary Care Doctor: - Call Health Connect at  [phone] - they can help you locate a primary care doctor that  accepts your insurance, provides certain services, etc. - Physician Referral Service- [phone]  Chronic Pain Problems: Organization         Address  Phone   Notes  [facility]  [phone] Patients need to be referred by their primary care doctor.   Medication Assistance: Organization         Address  Phone   Notes  [facility] Medication [facility] [address] [place] [place], [place] [address] [phone] --Must be a resident of [facility] -- Must have NO insurance coverage whatsoever (no Medicaid/ Medicare, etc.) -- The pt. MUST have a primary care doctor that directs their care regularly and follows them in the community   [facility]  [phone]   [employer]  [phone]    Agencies that provide inexpensive medical care: Organization         Address  Phone   Notes  [facility]  [phone]   [facility]    [phone]   [facility] [address] [place], [place] [address] [phone]   [facility] [address]  [place], [place] [phone]   Planned Parenthood    [phone]   [facility]    [phone]   [facility]  [address], [place] Phone:  [phone], Fax:  [phone] Hours of Operation:  9 am - 6 pm, M-F.  Also accepts Medicaid/Medicare and self-pay.  [facility]  [address], [place] Phone: [phone], Fax: [phone]. Hours of Operation:  8:30 am - 5:30 pm, M-F.  Also accepts Medicaid and self-pay.  [facility] [place] [address], [employer] Phone: [phone]   [facility] [address] [place], [place] (  [phone], Ext. [phone] Mondays & Thursdays: 7-9 AM.  First 15 patients are seen on a first come, first serve basis.    Medicaid-accepting [facility] Providers:  Organization         Address  Phone   Notes  [facility] [address], [place] [phone] Also accepts self-pay patients.  [facility] [address] [place], [place]  [phone]   [facility] [address], [place] [phone]   [facility] [address], [place] [phone]   Renaye Rakers [address], [place]   [phone] Only accepts [place] patients after they have their name applied to their card.   Self-Pay (no insurance) in [facility]:  Organization         Address  Phone   Notes  Sickle Cell Patients, [facility] [address] [place], [place] [phone]   [facility] [address] [place], [place] [phone]   [facility]  [address], [place] [phone]   [facility]/Dr. Osei-Bonsu  [address], [place] or [address], [place] [phone] Phone number for both [place] and [place] locations is the same.  [facility] [address], [place] [phone]   [facility] [address], [place] or [address] [phone]   [facility] [address] [place] [phone], phone; [phone], fax Sees patients 1st and 3rd Saturday of every month.  Must not qualify for public or private insurance (i.e. Medicaid, Medicare, [place] Health Choice, Veterans' Benefits)  Household income should be no more than 200% of the poverty level The clinic cannot treat you if you are pregnant or think you are pregnant  Sexually transmitted diseases are not treated at the clinic.    Dental Care: Organization         Address  Phone  Notes  [facility] Department of [facility] [address] [employer], [place] [phone] Accepts children up to age 56 who are enrolled in [place] or [place] Health Choice; pregnant women with a Medicaid card; and children who have applied for Medicaid or [place] Health Choice, but were declined, whose parents can pay a reduced fee at time of service.  [facility] Department of [facility]  [address], [place] [phone] Accepts children up to age 73 who are enrolled in [place] or [place] Health Choice; pregnant women with a Medicaid card; and children who have applied for Medicaid or [place] Health Choice, but were declined, whose parents can pay a reduced fee at time of service.  [facility]  [address] [place], [place] [phone] Patients are seen by appointment only. Walk-ins are not accepted. [facility] will see patients 21 years of age and older. Monday - Tuesday (8am-5pm) Most Wednesdays (8:30-5pm) $30 per visit, cash only  [facility] Adult Dental Access [facility]  [address], [facility] [phone] Patients are seen by appointment only. Walk-ins are not  accepted. [facility] will see patients 18 years of age and older. One Wednesday Evening (Monthly: Volunteer Based).  $30 per visit, cash only  [employer]  [phone])  [phone] for adults; Children under age 60, call Graduate Pediatric Dentistry at [phone]. Children aged 53-14, please call [phone] to request a pediatric application.  Dental services are provided in all areas of dental care including fillings, crowns and bridges, complete and partial dentures, implants, gum treatment, root canals, and extractions. Preventive care is also provided. Treatment is provided to both adults and children. Patients are selected via a lottery and there is often a waiting list.   [facility] [address], [place]  [phone] [email or web address]   [facility] [address] [place], [place] [phone], Ext. [phone] Second and Fourth Thursday of each month, opens at 6:30 AM; Clinic ends at 9 AM.  Patients are seen on a first-come first-served basis, and a limited number are seen during each clinic.   [facility]  [address] [place] [place], [place] [phone]   Eligibility Requirements You must have lived in [place], [place], or [place] counties for at least the last three months.   You cannot be eligible for state or federal sponsored [place], including [employer], [place], or [employer].   You generally cannot be eligible for healthcare insurance through your employer.    How to apply: Eligibility screenings are held every Tuesday and Wednesday afternoon from 1:00 pm until 4:00 pm. You do not need an appointment for the interview!  [facility] [address] [place], [place] [phone]   [facility] Health Department  [phone]   [facility] Health Department  [phone]   [facility] Health Department  [phone]    Behavioral Health Resources in the  Community: Intensive Outpatient Programs Organization         Address  Phone  Notes  [facility] [address] [place], [place] [phone]   [facility] Outpatient [address] [place], [place] [phone]   [facility] [address], [place], [place]  [phone]   [facility] [address]  [place], [place] [phone] or [phone]   Substance Abuse Resources Organization         Address  Phone  Notes  [facility]  [phone]   [facility]  [phone]   [facility]  [phone]   Floydene Flock  [phone]   Residential & Outpatient Substance Abuse Program  [phone]   Psychological Services Organization         Address  Phone  Notes  [facility]  [phone]   [facility] Services  [phone]   [facility] Mental Health [address], [place] [phone] or [phone]    Mobile Crisis Teams Organization         Address  Phone  Notes  [facility], Mobile Crisis Care Unit  [phone]   [facility]  [address] [place], [place] [phone]   Doristine Locks [address] [place] [phone]    Self-Help/Support Groups Organization         Address  Phone             Notes  [facility] [place] - variety of support groups  336- I7437963 Call for more information  Narcotics Anonymous (NA), [facility] [address], [employer] [place]  2 meetings at this location   Chief Executive Officer  Notes  [facility] [address]    [place]  [phone]   [facility]  [address], [place] [address], [place], [place]   Council [place], [employer] [phone] Admissions: 8am-3pm M-F  Incentives Substance [place] [address],    [place], [place] [phone]   [facility] [address] [place], [place], [place]   [facility] [address],  [place], [place]   [facility] - Intensive Outpatient [address], [place], [place]   [facility] [place]  [place], [place] [phone] or [phone]   [facility] ([facility]) [address] [place], [place] Accepts Medicaid  [facility] [employer] [address],  [place] [phone] Substance Abuse/Addiction Treatment   [facility] Organization         Address  Phone  Notes  [facility]  [phone]   Domenic Schwab, PhD [address] Arlis [address]a [place], [place]   [phone] or [phone]   [place] Deale Opal [place], [place] [phone]   [facility] [address] [place], [place] [phone] Insurance/Medicaid/sponsorship through [facility] [address] [place]                                    [place], [place] [phone] [place] [address], [place] [phone]    Dr. Adele Schilder  [phone]   [facility] Dept. 1) [address], [place] 2) [place] 3)  [place] [address], [place] [phone]  [phone]   [place] [phone] or [phone] (After Hours)

## 2015-06-02 NOTE — ED Notes (Signed)
Pt reports chronic pain to his lower back and right hip.

## 2015-06-02 NOTE — ED Provider Notes (Signed)
CSN: [id number]     Arrival date & time 06/02/15  1550 History  By signing my name below, I, Soijett Blue, attest that this documentation has been prepared under the direction and in the presence of Roxy Horseman, PA-C Electronically Signed: Soijett Blue, ED Scribe. 06/02/2015. 5:14 PM.   Chief Complaint  Patient presents with  . Back Pain  . Hip Pain      The history is provided by the patient. No language interpreter was used.    HPI Comments: Melvin Nguyen is a 49 y.o. male with a medical hx of cerebral palsy, HTN, and degenerative arthritis, who presents to the Emergency Department complaining of low back pain onset 2 months ago. He notes that his pain began when he was seen by his pain care doctor who gave him an injection that gave him residual pain. He notes that he has been let go from his pain management clinic due to him supposedly selling his Rx medications. Pt states that the Rx medications actually fell down a hole at the time. He notes that he has been without his medications for 2 months. He reports that the back pain does radiate to his right. He reports that he has trouble sleeping due to the pain. He states that he is having associated symptoms of right hip pain. Pt denies bowel/bladder incontinence, gait problem, color change, rash, wound, and any other symptoms.    Past Medical History  Diagnosis Date  . Obesity (BMI 30-39.9) 09/13/2011  . Cerebral palsy (HCC)   . Thyroid disease   . Hypertension   . Degenerative arthritis    Past Surgical History  Procedure Laterality Date  . Tooth extraction     History reviewed. No pertinent family history. Social History  Substance Use Topics  . Smoking status: Current Every Day Smoker -- 1.00 packs/day    Types: Cigarettes  . Smokeless tobacco: None  . Alcohol Use: No    Review of Systems  Gastrointestinal:       No bowel incontinence   Genitourinary:       No bladder incontinence  Musculoskeletal: Positive for  back pain and arthralgias. Negative for joint swelling and gait problem.  Skin: Negative for color change, rash and wound.      Allergies  Morphine and related and Penicillins  Home Medications   Prior to Admission medications   Medication Sig Start Date End Date Taking? Authorizing Provider  amLODipine (NORVASC) 10 MG tablet Take 1 tablet (10 mg total) by mouth daily. 06/05/14   Carney Living, MD  aspirin 81 MG tablet Take 81 mg by mouth daily.    Historical Provider, MD  atorvastatin (LIPITOR) 40 MG tablet Take 1 tablet (40 mg total) by mouth daily. 03/26/15   Carney Living, MD  baclofen (LIORESAL) 20 MG tablet take 1 tablet three times a day 04/10/15   Carney Living, MD  carvedilol (COREG) 12.5 MG tablet Take 1 tablet (12.5 mg total) by mouth 2 (two) times daily with a meal. 01/28/14   Carney Living, MD  doxycycline (VIBRAMYCIN) 100 MG capsule Take 1 capsule (100 mg total) by mouth 2 (two) times daily. 04/23/15   Ivery Quale, PA-C  hydrochlorothiazide (HYDRODIURIL) 25 MG tablet Take 1 tablet (25 mg total) by mouth daily. 06/05/14   Carney Living, MD  ibuprofen (ADVIL,MOTRIN) 800 MG tablet Take 1 tablet (800 mg total) by mouth 3 (three) times daily. 04/23/15   Ivery Quale, PA-C  levothyroxine (SYNTHROID, LEVOTHROID)  100 MCG tablet Take 1 tablet (100 mcg total) by mouth daily. 06/05/14 08/29/15  Carney Living, MD  lisinopril (PRINIVIL,ZESTRIL) 40 MG tablet Take 1 tablet (40 mg total) by mouth daily. 01/28/14   Carney Living, MD  Multiple Vitamin (MULTIVITAMIN) tablet Take 1 tablet by mouth daily.    Historical Provider, MD  omeprazole (PRILOSEC) 20 MG capsule Take 1 capsule (20 mg total) by mouth daily. 10/23/14   Carney Living, MD  oxyCODONE-acetaminophen (PERCOCET/ROXICET) 5-325 MG per tablet Take 1 tablet by mouth every 6 (six) hours as needed. 12/24/14   Ivery Quale, PA-C  potassium chloride (K-DUR) 10 MEQ tablet Take 2 tablets (20 mEq  total) by mouth daily. 09/12/14   Carney Living, MD  traMADol (ULTRAM) 50 MG tablet Take 1 tablet (50 mg total) by mouth every 8 (eight) hours as needed. 05/21/15   Ardith Dark, MD   BP 116/73 mmHg  Pulse 66  Temp(Src) 98.3 F (36.8 C) (Oral)  Resp 18  SpO2 98% Physical Exam  Constitutional: He is oriented to person, place, and time. He appears well-developed and well-nourished. No distress.  HENT:  Head: Normocephalic and atraumatic.  Eyes: Conjunctivae and EOM are normal. Right eye exhibits no discharge. Left eye exhibits no discharge. No scleral icterus.  Neck: Normal range of motion. Neck supple. No tracheal deviation present.  Cardiovascular: Normal rate, regular rhythm and normal heart sounds.  Exam reveals no gallop and no friction rub.   No murmur heard. Pulmonary/Chest: Effort normal and breath sounds normal. No respiratory distress. He has no wheezes.  Abdominal: Soft. He exhibits no distension. There is no tenderness.  Musculoskeletal: Normal range of motion.  Lumbar paraspinal muscles tender to palpation, no bony tenderness, step-offs, or gross abnormality or deformity of spine, patient is able to ambulate, moves all extremities  Bilateral great toe extension intact Bilateral plantar/dorsiflexion intact  Neurological: He is alert and oriented to person, place, and time.  Sensation and strength intact bilaterally   Skin: Skin is warm and dry. He is not diaphoretic.  No erythema, rash, or sign of abscess on back  Psychiatric: He has a normal mood and affect. His behavior is normal. Judgment and thought content normal.  Nursing note and vitals reviewed.   ED Course  Procedures (including critical care time) DIAGNOSTIC STUDIES: Oxygen Saturation is 98% on RA, nl by my interpretation.    COORDINATION OF CARE: 5:05 PM Discussed treatment plan with pt at bedside and pt agreed to plan.      MDM   Final diagnoses:  Chronic pain    Patient with back pain.   No neurological deficits and normal neuro exam.  Patient is ambulatory.  No loss of bowel or bladder control.  Doubt cauda equina.  Denies fever,  doubt epidural abscess or other lesion. Recommend back exercises, stretching, RICE.  Encouraged the patient that there could be a need for additional workup and/or imaging such as MRI, if the symptoms do not resolve. Patient advised that if the back pain does not resolve, or radiates, this could progress to more serious conditions and is encouraged to follow-up with PCP or orthopedics within 2 weeks.     I, Imogen Maddalena, personally performed the services described in this documentation. All medical record entries made by the scribe were at my direction and in my presence.  I have reviewed the chart and discharge instructions and agree that the record reflects my personal performance and is accurate and complete. Milo Schreier,  Chakita Mcgraw.  06/02/2015. 5:21 PM.      Roxy Horseman, PA-C 06/02/15 1721  Gerhard Munch, MD 06/02/15 [phone]

## 2015-06-05 DIAGNOSIS — Z6827 Body mass index (BMI) 27.0-27.9, adult: Secondary | ICD-10-CM | POA: Diagnosis not present

## 2015-06-05 DIAGNOSIS — M5136 Other intervertebral disc degeneration, lumbar region: Secondary | ICD-10-CM | POA: Diagnosis not present

## 2015-06-06 ENCOUNTER — Emergency Department (HOSPITAL_COMMUNITY): Payer: Medicare Other

## 2015-06-06 ENCOUNTER — Encounter (HOSPITAL_COMMUNITY): Payer: Self-pay | Admitting: Emergency Medicine

## 2015-06-06 ENCOUNTER — Emergency Department (HOSPITAL_COMMUNITY)
Admission: EM | Admit: 2015-06-06 | Discharge: 2015-06-06 | Disposition: A | Discharge status: Home / Self Care | Payer: Medicare Other | Attending: Emergency Medicine | Admitting: Emergency Medicine

## 2015-06-06 ENCOUNTER — Other Ambulatory Visit: Payer: Self-pay | Admitting: Family Medicine

## 2015-06-06 DIAGNOSIS — J159 Unspecified bacterial pneumonia: Secondary | ICD-10-CM | POA: Insufficient documentation

## 2015-06-06 DIAGNOSIS — Z792 Long term (current) use of antibiotics: Secondary | ICD-10-CM | POA: Insufficient documentation

## 2015-06-06 DIAGNOSIS — E876 Hypokalemia: Secondary | ICD-10-CM | POA: Diagnosis not present

## 2015-06-06 DIAGNOSIS — E079 Disorder of thyroid, unspecified: Secondary | ICD-10-CM | POA: Insufficient documentation

## 2015-06-06 DIAGNOSIS — Z7982 Long term (current) use of aspirin: Secondary | ICD-10-CM | POA: Insufficient documentation

## 2015-06-06 DIAGNOSIS — E669 Obesity, unspecified: Secondary | ICD-10-CM | POA: Diagnosis not present

## 2015-06-06 DIAGNOSIS — M1990 Unspecified osteoarthritis, unspecified site: Secondary | ICD-10-CM | POA: Diagnosis not present

## 2015-06-06 DIAGNOSIS — Z880 Allergy status to penicillin: Secondary | ICD-10-CM | POA: Diagnosis not present

## 2015-06-06 DIAGNOSIS — I10 Essential (primary) hypertension: Secondary | ICD-10-CM | POA: Diagnosis not present

## 2015-06-06 DIAGNOSIS — Z791 Long term (current) use of non-steroidal anti-inflammatories (NSAID): Secondary | ICD-10-CM | POA: Insufficient documentation

## 2015-06-06 DIAGNOSIS — J189 Pneumonia, unspecified organism: Secondary | ICD-10-CM | POA: Diagnosis not present

## 2015-06-06 DIAGNOSIS — M25552 Pain in left hip: Secondary | ICD-10-CM | POA: Insufficient documentation

## 2015-06-06 DIAGNOSIS — Z720 Tobacco use: Secondary | ICD-10-CM | POA: Insufficient documentation

## 2015-06-06 DIAGNOSIS — Z79899 Other long term (current) drug therapy: Secondary | ICD-10-CM | POA: Diagnosis not present

## 2015-06-06 DIAGNOSIS — G8929 Other chronic pain: Secondary | ICD-10-CM | POA: Diagnosis not present

## 2015-06-06 DIAGNOSIS — R0602 Shortness of breath: Secondary | ICD-10-CM | POA: Diagnosis not present

## 2015-06-06 DIAGNOSIS — R05 Cough: Secondary | ICD-10-CM | POA: Diagnosis not present

## 2015-06-06 DIAGNOSIS — M545 Low back pain: Secondary | ICD-10-CM | POA: Diagnosis not present

## 2015-06-06 DIAGNOSIS — R0789 Other chest pain: Secondary | ICD-10-CM | POA: Diagnosis not present

## 2015-06-06 DIAGNOSIS — R079 Chest pain, unspecified: Secondary | ICD-10-CM | POA: Diagnosis present

## 2015-06-06 LAB — CBC
HCT: 37 % — ABNORMAL LOW (ref 39.0–52.0)
HEMOGLOBIN: 12.6 g/dL — AB (ref 13.0–17.0)
MCH: 31.5 pg (ref 26.0–34.0)
MCHC: 34.1 g/dL (ref 30.0–36.0)
MCV: 92.5 fL (ref 78.0–100.0)
Platelets: 334 10*3/uL (ref 150–400)
RBC: 4 MIL/uL — ABNORMAL LOW (ref 4.22–5.81)
RDW: 13.6 % (ref 11.5–15.5)
WBC: 10.9 10*3/uL — ABNORMAL HIGH (ref 4.0–10.5)

## 2015-06-06 LAB — BASIC METABOLIC PANEL
Anion gap: 7 (ref 5–15)
BUN: 11 mg/dL (ref 6–20)
CALCIUM: 8.7 mg/dL — AB (ref 8.9–10.3)
CHLORIDE: 101 mmol/L (ref 101–111)
CO2: 30 mmol/L (ref 22–32)
CREATININE: 0.91 mg/dL (ref 0.61–1.24)
GFR calc Af Amer: >60 mL/min (ref >60)
GFR calc non Af Amer: >60 mL/min (ref >60)
Glucose, Bld: 93 mg/dL (ref 65–99)
Potassium: 2.7 mmol/L — CL (ref 3.5–5.1)
SODIUM: 138 mmol/L (ref 135–145)

## 2015-06-06 LAB — TROPONIN I

## 2015-06-06 MED ORDER — POTASSIUM CHLORIDE 10 MEQ/100ML IV SOLN
10.0000 meq | Freq: Once | INTRAVENOUS | Status: AC
  Administered 2015-06-06: 10 meq via INTRAVENOUS
  Filled 2015-06-06: qty 100

## 2015-06-06 MED ORDER — DEXTROSE 5 % IV SOLN
1.0000 g | Freq: Once | INTRAVENOUS | Status: AC
  Administered 2015-06-06: 1 g via INTRAVENOUS
  Filled 2015-06-06: qty 10

## 2015-06-06 MED ORDER — LEVOFLOXACIN 750 MG PO TABS: 750.0000 mg | ORAL_TABLET | Freq: Every day | ORAL | Status: DC

## 2015-06-06 MED ORDER — TRAMADOL HCL 50 MG PO TABS
50.0000 mg | ORAL_TABLET | Freq: Once | ORAL | Status: AC
  Administered 2015-06-06: 50 mg via ORAL
  Filled 2015-06-06: qty 1

## 2015-06-06 MED ORDER — POTASSIUM CHLORIDE CRYS ER 20 MEQ PO TBCR
40.0000 meq | EXTENDED_RELEASE_TABLET | Freq: Once | ORAL | Status: AC
  Administered 2015-06-06: 40 meq via ORAL
  Filled 2015-06-06: qty 2

## 2015-06-06 MED ORDER — POTASSIUM CHLORIDE CRYS ER 20 MEQ PO TBCR: EXTENDED_RELEASE_TABLET | ORAL | Status: DC

## 2015-06-06 MED ORDER — AZITHROMYCIN 500 MG IV SOLR
500.0000 mg | Freq: Once | INTRAVENOUS | Status: AC
  Administered 2015-06-06: 20:00:00 via INTRAVENOUS
  Filled 2015-06-06: qty 500

## 2015-06-06 NOTE — ED Notes (Signed)
Pt states that he has been having central chest pain with productive cough for the past 3 days.  Describes brown sputum, small amount.  Also c/o left hip pain that is making him fall more often than normal.

## 2015-06-06 NOTE — ED Notes (Signed)
CRITICAL VALUE ALERT  Critical value received:  Potassium 2.7  Date of notification:  06/06/2015  Time of notification:  1814  Critical value read back yes  Nurse who received alert: Mardene CelesteJoanna, RN  MD notified (1st page): Dr. Denton LankSteinl  Time of first page:  1815  MD notified (2nd page):  Time of second page:  Responding MD:  Denton LankSteinl  Time MD responded: [phone]

## 2015-06-06 NOTE — ED Notes (Signed)
Dr Denton LankSteinl notified of potassium 2.7

## 2015-06-06 NOTE — Discharge Instructions (Signed)
It was our pleasure to provide your ER care today - we hope that you feel better.  Rest. Drink adequate fluids.  Take tylenol as need.  You must see your primary care doctor should you need any pain medication prescription.   From today's labs, your potassium level is quite low (2.7) - eat plenty of fruits and vegetables, take potassium supplement as prescribed, and follow up with primary care doctor for recheck this Monday.  On today's chest xray, it appears you may have pneumonia vs other ill-defined opacity or mass.  Take antibiotic as prescribed.   You must have follow up chest xray with primary care doctor in approximately 1 months time to make sure appearance of chest xray clears or resolves (if it does not, you may need further imaging studies) - discuss with primary care doctor at follow up.   Follow up with primary care doctor this Monday for recheck.  Return to ER if worse, new symptoms, high fevers, weak/fainting, difficulty breathing, medical emergency, other concern.  You were given pain medication while in the ER - no driving for the next 4 hours.    Community-Acquired Pneumonia, Adult Pneumonia is an infection of the lungs. There are different types of pneumonia. One type can develop while a person is in a hospital. A different type, called community-acquired pneumonia, develops in people who are not, or have not recently been, in the hospital or other health care facility.  CAUSES Pneumonia may be caused by bacteria, viruses, or funguses. Community-acquired pneumonia is often caused by Streptococcus pneumonia bacteria. These bacteria are often passed from one person to another by breathing in droplets from the cough or sneeze of an infected person. RISK FACTORS The condition is more likely to develop in:  People who havechronic diseases, such as chronic obstructive pulmonary disease (COPD), asthma, congestive heart failure, cystic fibrosis, diabetes, or kidney  disease.  People who haveearly-stage or late-stage HIV.  People who havesickle cell disease.  People who havehad their spleen removed (splenectomy).  People who havepoor Administrator.  People who havemedical conditions that increase the risk of breathing in (aspirating) secretions their own mouth and nose.   People who havea weakened immune system (immunocompromised).  People who smoke.  People whotravel to areas where pneumonia-causing germs commonly exist.  People whoare around animal habitats or animals that have pneumonia-causing germs, including birds, bats, rabbits, cats, and farm animals. SYMPTOMS Symptoms of this condition include:  Adry cough.  A wet (productive) cough.  Fever.  Sweating.  Chest pain, especially when breathing deeply or coughing.  Rapid breathing or difficulty breathing.  Shortness of breath.  Shaking chills.  Fatigue.  Muscle aches. DIAGNOSIS Your health care provider will take a medical history and perform a physical exam. You may also have other tests, including:  Imaging studies of your chest, including X-rays.  Tests to check your blood oxygen level and other blood gases.  Other tests on blood, mucus (sputum), fluid around your lungs (pleural fluid), and urine. If your pneumonia is severe, other tests may be done to identify the specific cause of your illness. TREATMENT The type of treatment that you receive depends on many factors, such as the cause of your pneumonia, the medicines you take, and other medical conditions that you have. For most adults, treatment and recovery from pneumonia may occur at home. In some cases, treatment must happen in a hospital. Treatment may include:  Antibiotic medicines, if the pneumonia was caused by bacteria.  Antiviral medicines,  if the pneumonia was caused by a virus.  Medicines that are given by mouth or through an IV tube.  Oxygen.  Respiratory therapy. Although rare,  treating severe pneumonia may include:  Mechanical ventilation. This is done if you are not breathing well on your own and you cannot maintain a safe blood oxygen level.  Thoracentesis. This procedureremoves fluid around one lung or both lungs to help you breathe better. HOME CARE INSTRUCTIONS  Take over-the-counter and prescription medicines only as told by your health care provider.  Only takecough medicine if you are losing sleep. Understand that cough medicine can prevent your body's natural ability to remove mucus from your lungs.  If you were prescribed an antibiotic medicine, take it as told by your health care provider. Do not stop taking the antibiotic even if you start to feel better.  Sleep in a semi-upright position at night. Try sleeping in a reclining chair, or place a few pillows under your head.  Do not use tobacco products, including cigarettes, chewing tobacco, and e-cigarettes. If you need help quitting, ask your health care provider.  Drink enough water to keep your urine clear or pale yellow. This will help to thin out mucus secretions in your lungs. PREVENTION There are ways that you can decrease your risk of developing community-acquired pneumonia. Consider getting a pneumococcal vaccine if:  You are older than 51 years of age.  You are older than 51 years of age and are undergoing cancer treatment, have chronic lung disease, or have other medical conditions that affect your immune system. Ask your health care provider if this applies to you. There are different types and schedules of pneumococcal vaccines. Ask your health care provider which vaccination option is best for you. You may also prevent community-acquired pneumonia if you take these actions:  Get an influenza vaccine every year. Ask your health care provider which type of influenza vaccine is best for you.  Go to the dentist on a regular basis.  Wash your hands often. Use hand sanitizer if soap and  water are not available. SEEK MEDICAL CARE IF:  You have a fever.  You are losing sleep because you cannot control your cough with cough medicine. SEEK IMMEDIATE MEDICAL CARE IF:  You have worsening shortness of breath.  You have increased chest pain.  Your sickness becomes worse, especially if you are an older adult or have a weakened immune system.  You cough up blood.   This information is not intended to replace advice given to you by your health care provider. Make sure you discuss any questions you have with your health care provider.   Document Released: 08/09/2005 Document Revised: 04/30/2015 Document Reviewed: 12/04/2014 Elsevier Interactive Patient Education 2016 [employer]   Nonspecific Chest Pain  Chest pain can be caused by many different conditions. There is always a chance that your pain could be related to something serious, such as a heart attack or a blood clot in your lungs. Chest pain can also be caused by conditions that are not life-threatening. If you have chest pain, it is very important to follow up with your health care provider. CAUSES  Chest pain can be caused by:  Heartburn.  Pneumonia or bronchitis.  Anxiety or stress.  Inflammation around your heart (pericarditis) or lung (pleuritis or pleurisy).  A blood clot in your lung.  A collapsed lung (pneumothorax). It can develop suddenly on its own (spontaneous pneumothorax) or from trauma to the chest.  Shingles infection (varicella-zoster  virus).  Heart attack.  Damage to the bones, muscles, and cartilage that make up your chest wall. This can include:  Bruised bones due to injury.  Strained muscles or cartilage due to frequent or repeated coughing or overwork.  Fracture to one or more ribs.  Sore cartilage due to inflammation (costochondritis). RISK FACTORS  Risk factors for chest pain may include:  Activities that increase your risk for trauma or injury to your  chest.  Respiratory infections or conditions that cause frequent coughing.  Medical conditions or overeating that can cause heartburn.  Heart disease or family history of heart disease.  Conditions or health behaviors that increase your risk of developing a blood clot.  Having had chicken pox (varicella zoster). SIGNS AND SYMPTOMS Chest pain can feel like:  Burning or tingling on the surface of your chest or deep in your chest.  Crushing, pressure, aching, or squeezing pain.  Dull or sharp pain that is worse when you move, cough, or take a deep breath.  Pain that is also felt in your back, neck, shoulder, or arm, or pain that spreads to any of these areas. Your chest pain may come and go, or it may stay constant. DIAGNOSIS Lab tests or other studies may be needed to find the cause of your pain. Your health care provider may have you take a test called an ambulatory ECG (electrocardiogram). An ECG records your heartbeat patterns at the time the test is performed. You may also have other tests, such as:  Transthoracic echocardiogram (TTE). During echocardiography, sound waves are used to create a picture of all of the heart structures and to look at how blood flows through your heart.  Transesophageal echocardiogram (TEE).This is a more advanced imaging test that obtains images from inside your body. It allows your health care provider to see your heart in finer detail.  Cardiac monitoring. This allows your health care provider to monitor your heart rate and rhythm in real time.  Holter monitor. This is a portable device that records your heartbeat and can help to diagnose abnormal heartbeats. It allows your health care provider to track your heart activity for several days, if needed.  Stress tests. These can be done through exercise or by taking medicine that makes your heart beat more quickly.  Blood tests.  Imaging tests. TREATMENT  Your treatment depends on what is causing  your chest pain. Treatment may include:  Medicines. These may include:  Acid blockers for heartburn.  Anti-inflammatory medicine.  Pain medicine for inflammatory conditions.  Antibiotic medicine, if an infection is present.  Medicines to dissolve blood clots.  Medicines to treat coronary artery disease.  Supportive care for conditions that do not require medicines. This may include:  Resting.  Applying heat or cold packs to injured areas.  Limiting activities until pain decreases. HOME CARE INSTRUCTIONS  If you were prescribed an antibiotic medicine, finish it all even if you start to feel better.  Avoid any activities that bring on chest pain.  Do not use any tobacco products, including cigarettes, chewing tobacco, or electronic cigarettes. If you need help quitting, ask your health care provider.  Do not drink alcohol.  Take medicines only as directed by your health care provider.  Keep all follow-up visits as directed by your health care provider. This is important. This includes any further testing if your chest pain does not go away.  If heartburn is the cause for your chest pain, you may be told to keep your  head raised (elevated) while sleeping. This reduces the chance that acid will go from your stomach into your esophagus.  Make lifestyle changes as directed by your health care provider. These may include:  Getting regular exercise. Ask your health care provider to suggest some activities that are safe for you.  Eating a heart-healthy diet. A registered dietitian can help you to learn healthy eating options.  Maintaining a healthy weight.  Managing diabetes, if necessary.  Reducing stress. SEEK MEDICAL CARE IF:  Your chest pain does not go away after treatment.  You have a rash with blisters on your chest.  You have a fever. SEEK IMMEDIATE MEDICAL CARE IF:   Your chest pain is worse.  You have an increasing cough, or you cough up blood.  You  have severe abdominal pain.  You have severe weakness.  You faint.  You have chills.  You have sudden, unexplained chest discomfort.  You have sudden, unexplained discomfort in your arms, back, neck, or jaw.  You have shortness of breath at any time.  You suddenly start to sweat, or your skin gets clammy.  You feel nauseous or you vomit.  You suddenly feel light-headed or dizzy.  Your heart begins to beat quickly, or it feels like it is skipping beats. These symptoms may represent a serious problem that is an emergency. Do not wait to see if the symptoms will go away. Get medical help right away. Call your local emergency services (911 in the [place] Do not drive yourself to the hospital.   This information is not intended to replace advice given to you by your health care provider. Make sure you discuss any questions you have with your health care provider.   Document Released: 05/19/2005 Document Revised: 08/30/2014 Document Reviewed: 03/15/2014 Elsevier Interactive Patient Education 2016 [employer].    Hypokalemia Hypokalemia means that the amount of potassium in the blood is lower than normal.Potassium is a chemical, called an electrolyte, that helps regulate the amount of fluid in the body. It also stimulates muscle contraction and helps nerves function properly.Most of the body's potassium is inside of cells, and only a very small amount is in the blood. Because the amount in the blood is so small, minor changes can be life-threatening. CAUSES  Antibiotics.  Diarrhea or vomiting.  Using laxatives too much, which can cause diarrhea.  Chronic kidney disease.  Water pills (diuretics).  Eating disorders (bulimia).  Low magnesium level.  Sweating a lot. SIGNS AND SYMPTOMS  Weakness.  Constipation.  Fatigue.  Muscle cramps.  Mental confusion.  Skipped heartbeats or irregular heartbeat (palpitations).  Tingling or numbness. DIAGNOSIS  Your health  care provider can diagnose hypokalemia with blood tests. In addition to checking your potassium level, your health care provider may also check other lab tests. TREATMENT Hypokalemia can be treated with potassium supplements taken by mouth or adjustments in your current medicines. If your potassium level is very low, you may need to get potassium through a vein (IV) and be monitored in the hospital. A diet high in potassium is also helpful. Foods high in potassium are:  Nuts, such as peanuts and pistachios.  Seeds, such as sunflower seeds and pumpkin seeds.  Peas, lentils, and lima beans.  Whole grain and bran cereals and breads.  Fresh fruit and vegetables, such as apricots, avocado, bananas, cantaloupe, kiwi, oranges, tomatoes, asparagus, and potatoes.  Orange and tomato juices.  Red meats.  Fruit yogurt. HOME CARE INSTRUCTIONS  Take all medicines as prescribed  by your health care provider.  Maintain a healthy diet by including nutritious food, such as fruits, vegetables, nuts, whole grains, and lean meats.  If you are taking a laxative, be sure to follow the directions on the label. SEEK MEDICAL CARE IF:  Your weakness gets worse.  You feel your heart pounding or racing.  You are vomiting or having diarrhea.  You are diabetic and having trouble keeping your blood glucose in the normal range. SEEK IMMEDIATE MEDICAL CARE IF:  You have chest pain, shortness of breath, or dizziness.  You are vomiting or having diarrhea for more than 2 days.  You faint. MAKE SURE YOU:   Understand these instructions.  Will watch your condition.  Will get help right away if you are not doing well or get worse.   This information is not intended to replace advice given to you by your health care provider. Make sure you discuss any questions you have with your health care provider.   Document Released: 08/09/2005 Document Revised: 08/30/2014 Document Reviewed: 02/09/2013 Elsevier  Interactive Patient Education [employer]

## 2015-06-06 NOTE — ED Provider Notes (Signed)
CSN: [id number]     Arrival date & time 06/06/15  1708 History   First MD Initiated Contact with Patient 06/06/15 1711     Chief Complaint  Patient presents with  . Chest Pain     (Consider location/radiation/quality/duration/timing/severity/associated sxs/prior Treatment) Patient is a 49 y.o. male presenting with chest pain. The history is provided by the patient.  Chest Pain Associated symptoms: back pain and cough   Associated symptoms: no abdominal pain, no fever, no headache, no numbness, no palpitations, no shortness of breath, not vomiting and no weakness   Patient c/o having mid chest pain for the past 3 days, constant, at rest, worse w certain movements, palpation chest, and coughing. No associated nv, diaphoresis or sob. Recent increase in episodic, occasionally productive cough and coughing spells. No sore throat or other uri c/o. No fever or chills. No exertional cp or discomfort, no unusual doe or fatigue. Denies hx cad, or fam hx premature cad.  No pleuritic pain, no leg pain or swelling. No recent surgery, immobility or prolonged travel.   No dvt or pe hx.  Also c/o left hip pain in the past week, states he 'may have' fallen at some point last week, is unsure. Pain constant, dull, moderate, worse w movement. Has been ambulatory. Denies fainting, no dizziness. Has chronic low back pain, no recent change. No new/radicular pain. No numbness/weakness or loss of baseline functional ability.       Past Medical History  Diagnosis Date  . Obesity (BMI 30-39.9) 09/13/2011  . Cerebral palsy (HCC)   . Thyroid disease   . Hypertension   . Degenerative arthritis    Past Surgical History  Procedure Laterality Date  . Tooth extraction     History reviewed. No pertinent family history. Social History  Substance Use Topics  . Smoking status: Current Every Day Smoker -- 1.00 packs/day    Types: Cigarettes  . Smokeless tobacco: None  . Alcohol Use: No    Review of Systems   Constitutional: Negative for fever and chills.  HENT: Negative for sore throat.   Eyes: Negative for redness.  Respiratory: Positive for cough. Negative for shortness of breath.   Cardiovascular: Positive for chest pain. Negative for palpitations and leg swelling.  Gastrointestinal: Negative for vomiting, abdominal pain and diarrhea.  Genitourinary: Negative for dysuria and flank pain.  Musculoskeletal: Positive for back pain. Negative for neck pain.  Skin: Negative for rash.  Neurological: Negative for weakness, numbness and headaches.  Hematological: Does not bruise/bleed easily.  Psychiatric/Behavioral: Negative for confusion.      Allergies  Morphine and related and Penicillins  Home Medications   Prior to Admission medications   Medication Sig Start Date End Date Taking? Authorizing Provider  amLODipine (NORVASC) 10 MG tablet Take 1 tablet (10 mg total) by mouth daily. 06/05/14   Carney Living, MD  aspirin 81 MG tablet Take 81 mg by mouth daily.    Historical Provider, MD  atorvastatin (LIPITOR) 40 MG tablet Take 1 tablet (40 mg total) by mouth daily. 03/26/15   Carney Living, MD  baclofen (LIORESAL) 20 MG tablet take 1 tablet three times a day 04/10/15   Carney Living, MD  carvedilol (COREG) 12.5 MG tablet Take 1 tablet (12.5 mg total) by mouth 2 (two) times daily with a meal. 01/28/14   Carney Living, MD  doxycycline (VIBRAMYCIN) 100 MG capsule Take 1 capsule (100 mg total) by mouth 2 (two) times daily. 04/23/15   Ivery Quale,  PA-C  hydrochlorothiazide (HYDRODIURIL) 25 MG tablet Take 1 tablet (25 mg total) by mouth daily. 06/05/14   Carney Living, MD  ibuprofen (ADVIL,MOTRIN) 800 MG tablet Take 1 tablet (800 mg total) by mouth 3 (three) times daily. 04/23/15   Ivery Quale, PA-C  levothyroxine (SYNTHROID, LEVOTHROID) 100 MCG tablet Take 1 tablet (100 mcg total) by mouth daily. 06/05/14 08/29/15  Carney Living, MD  lisinopril  (PRINIVIL,ZESTRIL) 40 MG tablet Take 1 tablet (40 mg total) by mouth daily. 01/28/14   Carney Living, MD  Multiple Vitamin (MULTIVITAMIN) tablet Take 1 tablet by mouth daily.    Historical Provider, MD  omeprazole (PRILOSEC) 20 MG capsule Take 1 capsule (20 mg total) by mouth daily. 10/23/14   Carney Living, MD  oxyCODONE-acetaminophen (PERCOCET/ROXICET) 5-325 MG per tablet Take 1 tablet by mouth every 6 (six) hours as needed. 12/24/14   Ivery Quale, PA-C  potassium chloride (K-DUR) 10 MEQ tablet Take 2 tablets (20 mEq total) by mouth daily. 09/12/14   Carney Living, MD  traMADol (ULTRAM) 50 MG tablet Take 1 tablet (50 mg total) by mouth every 8 (eight) hours as needed. 05/21/15   Ardith Dark, MD   BP 116/82 mmHg  Pulse 67  Temp(Src) 98.4 F (36.9 C) (Oral)  Resp 16  Ht  (1.6 m)  Wt 152 lb (68.947 kg)  BMI 26.93 kg/m2  SpO2 100% Physical Exam  Constitutional: He is oriented to person, place, and time. He appears well-developed and well-nourished. No distress.  HENT:  Head: Atraumatic.  Eyes: Conjunctivae are normal. No scleral icterus.  Neck: Normal range of motion. Neck supple. No tracheal deviation present.  Cardiovascular: Normal rate, regular rhythm, normal heart sounds and intact distal pulses.  Exam reveals no gallop and no friction rub.   No murmur heard. Pulmonary/Chest: Effort normal. No accessory muscle usage. No respiratory distress. He exhibits tenderness.  Rhonchi left base  Abdominal: Soft. Bowel sounds are normal. He exhibits no distension and no mass. There is no tenderness. There is no rebound and no guarding.  Genitourinary:  No cva tenderness  Musculoskeletal: Normal range of motion. He exhibits no edema.  Mild tenderness left hip, no sts or skin changes. TLS spine non tender.  No other focal bony tenderness noted on ext exam, distal pulses palp.   Neurological: He is alert and oriented to person, place, and time.  Motor intact bil, stre 5/5.  sens grossly intact.   Skin: Skin is warm and dry. No rash noted. He is not diaphoretic.  Psychiatric: He has a normal mood and affect.  Nursing note and vitals reviewed.   ED Course  Procedures (including critical care time) Labs Review  Results for orders placed or performed during the hospital encounter of 06/06/15  CBC  Result Value Ref Range   WBC 10.9 (H) 4.0 - 10.5 K/uL   RBC 4.00 (L) 4.22 - 5.81 MIL/uL   Hemoglobin 12.6 (L) 13.0 - 17.0 g/dL   HCT 40.9 (L) 81.1 - 91.4 %   MCV 92.5 78.0 - 100.0 fL   MCH 31.5 26.0 - 34.0 pg   MCHC 34.1 30.0 - 36.0 g/dL   RDW 78.2 95.6 - 21.3 %   Platelets 334 150 - 400 K/uL  Basic metabolic panel  Result Value Ref Range   Sodium 138 135 - 145 mmol/L   Potassium 2.7 (LL) 3.5 - 5.1 mmol/L   Chloride 101 101 - 111 mmol/L   CO2 30 22 -  32 mmol/L   Glucose, Bld 93 65 - 99 mg/dL   BUN 11 6 - 20 mg/dL   Creatinine, Ser 9.600.91 0.61 - 1.24 mg/dL   Calcium 8.7 (L) 8.9 - 10.3 mg/dL   GFR calc non Af Amer >60 >60 mL/min   GFR calc Af Amer >60 >60 mL/min   Anion gap 7 5 - 15  Troponin I  Result Value Ref Range   Troponin I <0.03 <0.031 ng/mL   Dg Chest 2 View  06/06/2015  CLINICAL DATA:  49 year old male with central chest pain and productive cough. Mild shortness of breath for the past 3 days. EXAM: CHEST  2 VIEW COMPARISON:  No priors. FINDINGS: Ill-defined opacity in the region of the left lower lobe. Right lung appears clear. No pleural effusions. No evidence of pulmonary edema. Heart size is normal. Mediastinal contours are slightly distorted by patient's rotation to the right. IMPRESSION: 1. Ill-defined opacity in the left lung base may reflect a mild bronchopneumonia or sequela of recent aspiration. Followup PA and lateral chest X-ray is recommended in 3-4 weeks following trial of antibiotic therapy to ensure resolution and exclude underlying malignancy. Electronically Signed   By: Trudie Reedaniel  Entrikin M.D.   On: 06/06/2015 18:27    Dg Hip Unilat  W Or W/o Pelvis 2-3 Views Left  06/06/2015  CLINICAL DATA:  49 year old male with acute left hip pain for 2 weeks following fall. Initial encounter. EXAM: DG HIP (WITH OR WITHOUT PELVIS) 2-3V LEFT COMPARISON:  01/14/2009 radiograph FINDINGS: There is no evidence of acute fracture, subluxation or dislocation. No focal bony lesions are identified. Degenerative changes in the lower lumbar spine again noted. IMPRESSION: No evidence of acute bony abnormality. Degenerative changes in the lower lumbar spine. Electronically Signed   By: Harmon PierJeffrey  Hu M.D.   On: 06/06/2015 18:27      I have personally reviewed and evaluated these images and lab results as part of my medical decision-making.   EKG Interpretation   Date/Time:  Friday June 06 2015 17:15:45 EDT Ventricular Rate:  68 PR Interval:  132 QRS Duration: 102 QT Interval:  410 QTC Calculation: 436 R Axis:   70 Text Interpretation:  Normal sinus rhythm Normal ECG No significant change  since last tracing Confirmed by Denton LankSTEINL  MD, Caryn BeeKEVIN ([id number]) on 06/06/2015  5:29:26 PM      MDM   Labs. Ecg. Cxr.  Reviewed nursing notes and prior charts for additional history.   cxr result noted. Rocephin and zithromax iv.   k low. kcl 10 meq iv, and 40 meq po.   Ultram for pain.  Recheck pt comfortable, no increased wob.  Tolerating po fluids.  Pt is eating and drinking, ambulating without sob.   Pt currently appears stable for d/c.  Return precautions provided.      Cathren LaineKevin Xiana Carns, MD 06/06/15 2001

## 2015-06-09 ENCOUNTER — Other Ambulatory Visit: Payer: Self-pay | Admitting: Family Medicine

## 2015-06-09 MED ORDER — LISINOPRIL 40 MG PO TABS: 40.0000 mg | ORAL_TABLET | Freq: Every day | ORAL | Status: DC

## 2015-06-16 ENCOUNTER — Telehealth: Payer: Self-pay | Admitting: Family Medicine

## 2015-06-16 DIAGNOSIS — M545 Low back pain, unspecified: Secondary | ICD-10-CM

## 2015-06-16 NOTE — Telephone Encounter (Signed)
Will send to MD.  Patient had a referral to [facility] in march of this year.  Will need a new referral placed since it has been 6 months.  Melvin Nguyen,CMA

## 2015-06-16 NOTE — Telephone Encounter (Signed)
Need referral to [facility] facility.  Please contact patient asap when completed.

## 2015-06-16 NOTE — Telephone Encounter (Signed)
Referral placed.

## 2015-06-25 ENCOUNTER — Ambulatory Visit (INDEPENDENT_AMBULATORY_CARE_PROVIDER_SITE_OTHER): Payer: Medicare Other | Admitting: Family Medicine

## 2015-06-25 ENCOUNTER — Encounter: Payer: Self-pay | Admitting: Family Medicine

## 2015-06-25 VITALS — BP 126/84 | HR 59 | Temp 98.3°F | Ht 63.0 in | Wt 154.0 lb

## 2015-06-25 DIAGNOSIS — E785 Hyperlipidemia, unspecified: Secondary | ICD-10-CM

## 2015-06-25 DIAGNOSIS — M545 Low back pain, unspecified: Secondary | ICD-10-CM

## 2015-06-25 DIAGNOSIS — R938 Abnormal findings on diagnostic imaging of other specified body structures: Secondary | ICD-10-CM

## 2015-06-25 DIAGNOSIS — I10 Essential (primary) hypertension: Secondary | ICD-10-CM | POA: Diagnosis not present

## 2015-06-25 DIAGNOSIS — R9389 Abnormal findings on diagnostic imaging of other specified body structures: Secondary | ICD-10-CM

## 2015-06-25 MED ORDER — TRAMADOL HCL 50 MG PO TABS: 50.0000 mg | ORAL_TABLET | Freq: Three times a day (TID) | ORAL | Status: DC | PRN

## 2015-06-25 MED ORDER — IBUPROFEN 600 MG PO TABS
600.0000 mg | ORAL_TABLET | Freq: Three times a day (TID) | ORAL | Status: DC | PRN
Start: 2015-06-25 — End: 2015-11-04

## 2015-06-25 NOTE — Assessment & Plan Note (Signed)
Worsened without red flags.  Refilled tramadol and encouraged only prn NSAID use.  He is applying to a pain center

## 2015-06-25 NOTE — Progress Notes (Signed)
   Subjective:    Patient ID: Melvin Nguyen, male    DOB: 19-Jan-1966, 49 y.o.   MRN: [id number]  HPI  Back Pain Worsened since has been out of all pain meds.  Trying to get into pain center.  No new weakness or incontience.  Using otc analgesics which help not much.     HYPERTENSION Disease Monitoring Home BP Monitoring (Severity) not checking currernlty living in his car Symptoms - Chest pain- no    Dyspnea- no Medications(Modifying factors) Compliance-  Taking regularly. Lightheadedness-  no  Edema- no Timing - continuous  Duration - years ROS - See HPI  PMH Lab Review   POTASSIUM  Date Value Ref Range Status  06/06/2015 2.7* 3.5 - 5.1 mmol/L Final    Comment:    CRITICAL RESULT CALLED TO, READ BACK BY AND VERIFIED WITH: CARTER,J ON 06/06/15 AT 1810 BY LOY,C    SODIUM  Date Value Ref Range Status  06/06/2015 138 135 - 145 mmol/L Final   CREAT  Date Value Ref Range Status  09/11/2014 0.85 0.50 - 1.35 mg/dL Final   CREATININE, SER  Date Value Ref Range Status  06/06/2015 0.91 0.61 - 1.24 mg/dL Final      HYPERLIPIDEMIA Symptoms Chest pain on exertion:  no   Leg claudication:   no Medications (modifying factor): Compliance- taking lipitor daily  - no new CVA symptoms Right upper quadrant pain- no  Muscle aches- no Duration - years   Timing - continuous    Component Value Date/Time   CHOL 195 02/21/2015 0524   TRIG 63 02/21/2015 0524   HDL 39* 02/21/2015 0524   VLDL 13 02/21/2015 0524   CHOLHDL 5.0 02/21/2015 0524   TOBACCO USE Severity Smokes about 1/2 ppd Modifying Factors - reasons they smoke - stress worse being homelss Timing - when do they smoke when stressed  Duration - years Symptoms - Shortness of breath - no    Cough - occsl    Chief Complaint noted Review of Symptoms - see HPI PMH - Smoking status noted.   Vital Signs reviewed    Review of Systems     Objective:   Physical Exam  Alert Has CP his gait is unchanged Heart - Regular rate  and rhythm.  No murmurs, gallops or rubs.    Lungs:  Normal respiratory effort, chest expands symmetrically. Lungs are clear to auscultation, no crackles or wheezes.       Assessment & Plan:

## 2015-06-25 NOTE — Addendum Note (Signed)
Addended by: Pearlean BrownieHAMBLISS, Ritik Stavola L on: 06/25/2015 01:46 PM   Modules accepted: Orders

## 2015-06-25 NOTE — Patient Instructions (Signed)
Good to see you today!  Thanks for coming in.  For Pain  Take tramadol up to three times a day and Iburprofen as needed but try not to take regulary and take with food  For the BP - keep taking all your meds as you are  Stopping Smoking is your most important health issue - think of substitute for them  You need to have another Chest xray in December - I will put in the order  Come back in 2-3 months

## 2015-06-25 NOTE — Assessment & Plan Note (Signed)
BP Readings from Last 3 Encounters:  06/25/15 126/84  06/06/15 125/83  06/02/15 116/73   At goal

## 2015-06-25 NOTE — Assessment & Plan Note (Signed)
Lab Results  Component Value Date   LDLCALC 143* 02/21/2015   He seems to be taking statin.  Will fu with lipid level in future

## 2015-07-04 ENCOUNTER — Other Ambulatory Visit: Payer: Self-pay | Admitting: Family Medicine

## 2015-07-09 ENCOUNTER — Encounter: Payer: Self-pay | Admitting: Family Medicine

## 2015-08-22 ENCOUNTER — Other Ambulatory Visit: Payer: Self-pay | Admitting: Family Medicine

## 2015-11-04 ENCOUNTER — Other Ambulatory Visit: Payer: Self-pay | Admitting: *Deleted

## 2015-11-04 MED ORDER — IBUPROFEN 600 MG PO TABS: 600.0000 mg | ORAL_TABLET | Freq: Three times a day (TID) | ORAL | Status: DC | PRN

## 2015-11-04 MED ORDER — BACLOFEN 20 MG PO TABS: 20.0000 mg | ORAL_TABLET | Freq: Three times a day (TID) | ORAL | Status: DC

## 2015-12-01 ENCOUNTER — Other Ambulatory Visit: Payer: Self-pay | Admitting: *Deleted

## 2015-12-01 MED ORDER — LEVOTHYROXINE SODIUM 100 MCG PO TABS: 100.0000 ug | ORAL_TABLET | Freq: Every day | ORAL | Status: DC

## 2016-01-21 ENCOUNTER — Other Ambulatory Visit: Payer: Self-pay | Admitting: *Deleted

## 2016-01-21 MED ORDER — LISINOPRIL 40 MG PO TABS: 40.0000 mg | ORAL_TABLET | Freq: Every day | ORAL | Status: DC

## 2016-01-21 MED ORDER — LEVOTHYROXINE SODIUM 100 MCG PO TABS: 100.0000 ug | ORAL_TABLET | Freq: Every day | ORAL | Status: DC

## 2016-01-21 MED ORDER — AMLODIPINE BESYLATE 10 MG PO TABS: 10.0000 mg | ORAL_TABLET | Freq: Every day | ORAL | Status: DC

## 2016-01-21 MED ORDER — IBUPROFEN 600 MG PO TABS: 600.0000 mg | ORAL_TABLET | Freq: Three times a day (TID) | ORAL | Status: DC | PRN

## 2016-01-21 NOTE — Telephone Encounter (Signed)
Pt states that he has none of the following medications left.  He would like a refill to last him until his appointment on 02/04/16. Aemilia Dedrick, Maryjo RochesterJessica Dawn, CMA

## 2016-01-21 NOTE — Telephone Encounter (Signed)
Will forward to MD. Page, cma.

## 2016-02-04 ENCOUNTER — Ambulatory Visit (INDEPENDENT_AMBULATORY_CARE_PROVIDER_SITE_OTHER): Payer: Medicare Other | Admitting: Family Medicine

## 2016-02-04 ENCOUNTER — Encounter: Payer: Self-pay | Admitting: Family Medicine

## 2016-02-04 VITALS — BP 160/93 | HR 63 | Temp 98.2°F | Wt 163.0 lb

## 2016-02-04 DIAGNOSIS — I10 Essential (primary) hypertension: Secondary | ICD-10-CM | POA: Diagnosis not present

## 2016-02-04 DIAGNOSIS — E876 Hypokalemia: Secondary | ICD-10-CM

## 2016-02-04 DIAGNOSIS — E785 Hyperlipidemia, unspecified: Secondary | ICD-10-CM

## 2016-02-04 MED ORDER — POTASSIUM CHLORIDE ER 10 MEQ PO TBCR: 20.0000 meq | EXTENDED_RELEASE_TABLET | Freq: Every day | ORAL | Status: DC

## 2016-02-04 MED ORDER — GABAPENTIN 100 MG PO CAPS: 300.0000 mg | ORAL_CAPSULE | Freq: Every day | ORAL | Status: DC

## 2016-02-04 MED ORDER — TRAMADOL HCL 50 MG PO TABS: 50.0000 mg | ORAL_TABLET | Freq: Three times a day (TID) | ORAL | Status: DC | PRN

## 2016-02-04 MED ORDER — ATORVASTATIN CALCIUM 40 MG PO TABS: 40.0000 mg | ORAL_TABLET | Freq: Every day | ORAL | Status: DC

## 2016-02-04 MED ORDER — LEVOTHYROXINE SODIUM 100 MCG PO TABS: 100.0000 ug | ORAL_TABLET | Freq: Every day | ORAL | Status: DC

## 2016-02-04 MED ORDER — ASPIRIN 81 MG PO TABS: 81.0000 mg | ORAL_TABLET | Freq: Every day | ORAL | Status: AC

## 2016-02-04 MED ORDER — OMEPRAZOLE 20 MG PO CPDR: 20.0000 mg | DELAYED_RELEASE_CAPSULE | Freq: Every day | ORAL | Status: DC | PRN

## 2016-02-04 MED ORDER — LISINOPRIL 40 MG PO TABS: 40.0000 mg | ORAL_TABLET | Freq: Every day | ORAL | Status: DC

## 2016-02-04 MED ORDER — CARVEDILOL 12.5 MG PO TABS: 12.5000 mg | ORAL_TABLET | Freq: Two times a day (BID) | ORAL | Status: AC

## 2016-02-04 NOTE — Patient Instructions (Signed)
Good to see you today!  Thanks for coming in.  I will call you if your lab tests are not normal.  Otherwise we will discuss them at your next visit.  Come back in 1 month for a blood pressure recheck

## 2016-02-04 NOTE — Progress Notes (Signed)
S HYPERTENSION Disease Monitoring: Blood pressure range-not checking Chest pain, palpitations- no      Dyspnea- no Medications: Compliance- only taking some of his meds Lightheadedness,Syncope- no   Edema- no  HypoK Taking potassium daily and acei.   No leg cramps or palpitations   HYPERLIPIDEMIA Disease Monitoring: See symptoms for Hypertension Medications: Compliance- not taking atorvastatin or asa Right upper quadrant pain- no  Muscle aches- no  Monitoring Labs and Parameters Last A1C:  Lab Results  Component Value Date   HGBA1C 5.6 02/21/2015    Last Lipid:     Component Value Date/Time   CHOL 195 02/21/2015 0524   HDL 39* 02/21/2015 0524    Last Bmet  POTASSIUM  Date Value Ref Range Status  06/06/2015 2.7* 3.5 - 5.1 mmol/L Final    Comment:    CRITICAL RESULT CALLED TO, READ BACK BY AND VERIFIED WITH: CARTER,J ON 06/06/15 AT 1810 BY LOY,C    SODIUM  Date Value Ref Range Status  06/06/2015 138 135 - 145 mmol/L Final   CREAT  Date Value Ref Range Status  09/11/2014 0.85 0.50 - 1.35 mg/dL Final   CREATININE, SER  Date Value Ref Range Status  06/06/2015 0.91 0.61 - 1.24 mg/dL Final      Last BPs:  BP Readings from Last 3 Encounters:  02/04/16 160/93  06/25/15 126/84  06/06/15 125/83    Chief Complaint noted Review of Symptoms - see HPI PMH - Smoking status noted.   Vital Signs reviewed   O Heart - Regular rate and rhythm.  No murmurs, gallops or rubs.    Lungs:  Normal respiratory effort, chest expands symmetrically. Lungs are clear to auscultation, no crackles or wheezes. Extremities:  No cyanosis, edema, or deformity noted with good range of motion of all major joints.   Able to get up and down from exam table without difficulty,  Does walk with a limp

## 2016-02-04 NOTE — Assessment & Plan Note (Addendum)
Not at goal.  Reorder all antihtn and follow up

## 2016-02-05 LAB — BASIC METABOLIC PANEL
BUN: 20 mg/dL (ref 7–25)
CHLORIDE: 101 mmol/L (ref 98–110)
CO2: 23 mmol/L (ref 20–31)
CREATININE: 1.06 mg/dL (ref 0.70–1.33)
Calcium: 8.9 mg/dL (ref 8.6–10.3)
Glucose, Bld: 110 mg/dL — ABNORMAL HIGH (ref 65–99)
POTASSIUM: 3.2 mmol/L — AB (ref 3.5–5.3)
SODIUM: 142 mmol/L (ref 135–146)

## 2016-02-05 NOTE — Assessment & Plan Note (Signed)
Not controlled Off of statin - restart

## 2016-02-05 NOTE — Assessment & Plan Note (Signed)
Will need to follow labs closely given he is on acei and K and has had low values below

## 2016-02-13 ENCOUNTER — Encounter: Payer: Self-pay | Admitting: Family Medicine

## 2016-03-02 ENCOUNTER — Encounter: Payer: Self-pay | Admitting: Family Medicine

## 2016-03-02 DIAGNOSIS — Z7189 Other specified counseling: Secondary | ICD-10-CM | POA: Insufficient documentation

## 2016-03-03 ENCOUNTER — Ambulatory Visit (INDEPENDENT_AMBULATORY_CARE_PROVIDER_SITE_OTHER): Payer: Medicare Other | Admitting: Family Medicine

## 2016-03-03 ENCOUNTER — Encounter: Payer: Self-pay | Admitting: Family Medicine

## 2016-03-03 VITALS — BP 131/79 | HR 54 | Temp 97.9°F | Wt 164.0 lb

## 2016-03-03 DIAGNOSIS — E038 Other specified hypothyroidism: Secondary | ICD-10-CM | POA: Diagnosis not present

## 2016-03-03 DIAGNOSIS — Z1211 Encounter for screening for malignant neoplasm of colon: Secondary | ICD-10-CM

## 2016-03-03 DIAGNOSIS — E876 Hypokalemia: Secondary | ICD-10-CM

## 2016-03-03 DIAGNOSIS — E785 Hyperlipidemia, unspecified: Secondary | ICD-10-CM | POA: Diagnosis not present

## 2016-03-03 DIAGNOSIS — I10 Essential (primary) hypertension: Secondary | ICD-10-CM

## 2016-03-03 LAB — BASIC METABOLIC PANEL
BUN: 10 mg/dL (ref 7–25)
CALCIUM: 8.9 mg/dL (ref 8.6–10.3)
CO2: 30 mmol/L (ref 20–31)
CREATININE: 0.9 mg/dL (ref 0.70–1.33)
Chloride: 100 mmol/L (ref 98–110)
GLUCOSE: 72 mg/dL (ref 65–99)
Potassium: 3.1 mmol/L — ABNORMAL LOW (ref 3.5–5.3)
SODIUM: 139 mmol/L (ref 135–146)

## 2016-03-03 LAB — TSH: TSH: 2.02 m[IU]/L (ref 0.40–4.50)

## 2016-03-03 MED ORDER — IBUPROFEN 600 MG PO TABS: 600.0000 mg | ORAL_TABLET | Freq: Three times a day (TID) | ORAL | Status: AC | PRN

## 2016-03-03 MED ORDER — BACLOFEN 20 MG PO TABS: 20.0000 mg | ORAL_TABLET | Freq: Three times a day (TID) | ORAL | Status: AC

## 2016-03-03 MED ORDER — LEVOTHYROXINE SODIUM 100 MCG PO TABS: 100.0000 ug | ORAL_TABLET | Freq: Every day | ORAL | Status: AC

## 2016-03-03 MED ORDER — GABAPENTIN 100 MG PO CAPS: 300.0000 mg | ORAL_CAPSULE | Freq: Every day | ORAL | Status: DC

## 2016-03-03 NOTE — Assessment & Plan Note (Signed)
Improved at goal - check labs for K

## 2016-03-03 NOTE — Progress Notes (Signed)
Subjective  Patient is presenting with the following illnesses  HYPERLIPIDEMIA Symptoms Chest pain on exertion:  no   Leg claudication:   no Medications (modifying factor): Compliance- daily lipitor Right upper quadrant pain- no  Muscle aches- no Duration - years   Timing - continuous    Component Value Date/Time   CHOL 195 02/21/2015 0524   TRIG 63 02/21/2015 0524   HDL 39* 02/21/2015 0524   VLDL 13 02/21/2015 0524   CHOLHDL 5.0 02/21/2015 0524    HYPOTHYROIDISM Disease Monitoring Weight changes: has been slowly gaining wt  Skin Changes: no Palpitations: no Heat/Cold intolerance: no Duration - years  Timing - continuous  Severity - controlled  Medication Monitoring (modifying factors) Compliance:  Has taken regularly since last visit   Last TSH:   Lab Results  Component Value Date   TSH 3.473 09/11/2014    HYPERTENSION Disease Monitoring Home BP Monitoring (Severity) not checking Symptoms - Chest pain- no    Dyspnea- no Medications(Modifying factors) Compliance-  Has all his meds. Lightheadedness-  no  Edema- no Timing - continuous  Duration - years ROS - See HPI  PMH Lab Review   POTASSIUM  Date Value Ref Range Status  02/04/2016 3.2* 3.5 - 5.3 mmol/L Final   SODIUM  Date Value Ref Range Status  02/04/2016 142 135 - 146 mmol/L Final   CREAT  Date Value Ref Range Status  02/04/2016 1.06 0.70 - 1.33 mg/dL Final    Comment:      For patients > or = 52 years of age: The upper reference limit for Creatinine is approximately 13% higher for people identified as African-American.      CREATININE, SER  Date Value Ref Range Status  06/06/2015 0.91 0.61 - 1.24 mg/dL Final     Chief Complaint noted Review of Symptoms - see HPI PMH - Smoking status noted.     Objective Vital Signs reviewed Heart - Regular rate and rhythm.  No murmurs, gallops or rubs.    Lungs:  Normal respiratory effort, chest expands symmetrically. Lungs are clear to auscultation, no  crackles or wheezes.   Assessments/Plans  See Encounter view if individual problem A/Ps not visible See after visit summary for details of patient instuctions

## 2016-03-03 NOTE — Assessment & Plan Note (Signed)
Stable on lipitor

## 2016-03-03 NOTE — Assessment & Plan Note (Signed)
Check labs 

## 2016-03-03 NOTE — Patient Instructions (Signed)
Good to see you today!  Thanks for coming in.  Consider stopping smoking completely - drop the last 2!  For sleep try going to bed around 1030    You want to lose weight - cut back on serving size and slowly decrease the amount of sugar in your tea - aim to lose 1-2 lbs a week  I will call you if your tests are not good.  Otherwise I will send you a letter.  If you do not hear from me with in 2 weeks please call our office.     Come back in 3 months

## 2016-03-03 NOTE — Assessment & Plan Note (Signed)
Taking synthroid regularly now.  Check TSH

## 2016-03-04 ENCOUNTER — Telehealth: Payer: Self-pay | Admitting: Family Medicine

## 2016-03-04 DIAGNOSIS — E876 Hypokalemia: Secondary | ICD-10-CM

## 2016-03-04 NOTE — Telephone Encounter (Signed)
Left message to return call 

## 2016-03-05 MED ORDER — POTASSIUM CHLORIDE ER 10 MEQ PO TBCR: 30.0000 meq | EXTENDED_RELEASE_TABLET | Freq: Every day | ORAL | Status: DC

## 2016-03-05 NOTE — Telephone Encounter (Signed)
Pt returns call . Melvin Nguyen, Melvin Nguyen, CMA

## 2016-03-05 NOTE — Telephone Encounter (Signed)
Called busy

## 2016-03-05 NOTE — Telephone Encounter (Signed)
He is taking 2 K tabs a day  Asked to increase to 3 tabs a day  Come in for bmet in 1-2 weeks

## 2016-04-18 IMAGING — MR MR LUMBAR SPINE W/O CM
4 of 5 series · 22 of 48 positions shown · non-contrast
Comparison: 09/16/2008

CLINICAL DATA: Chronic low back pain, 10 years duration. Pain is
central. Numbness in weakness in both feet and legs. Recent back
injections. Spondylosis without myelopathy.

EXAM:
MRI LUMBAR SPINE WITHOUT CONTRAST
TECHNIQUE: Multiplanar, multisequence MR imaging of the lumbar spine was
performed. No intravenous contrast was administered.

[Series 5: T2 · sagittal · 4.0mm · 0.68mm/px · 6 of 15 slices shown (1 of 2)]
[im 1/15]
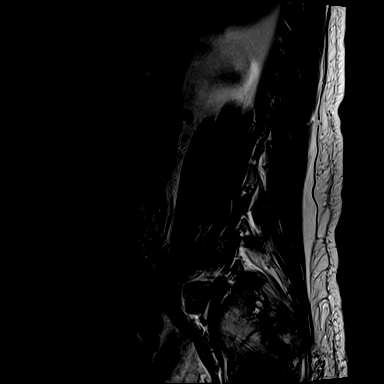
[im 3/15]
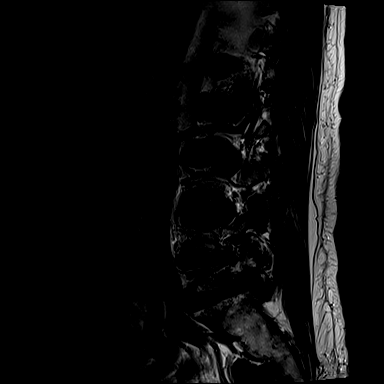
[im 6/15]
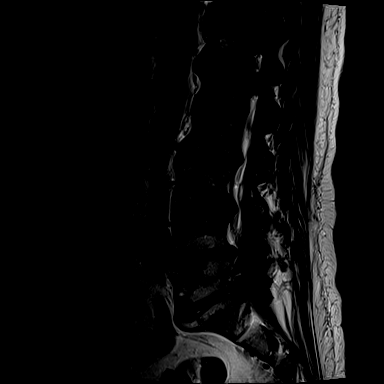
[im 9/15]
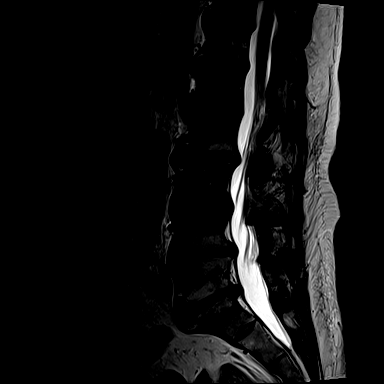
[im 12/15]
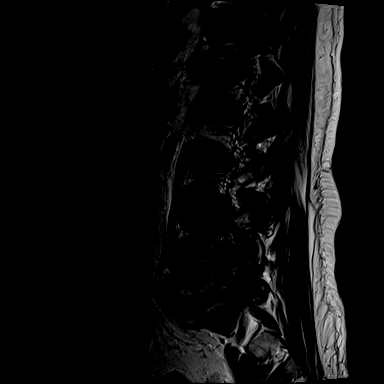
[im 15/15]
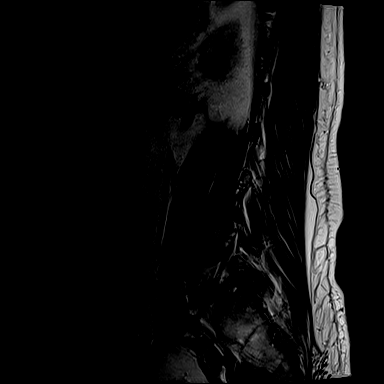

[Series 7: T1 · sagittal · 4.0mm · 0.81mm/px · 5 of 15 slices shown (1 of 2)]
[im 1/15]
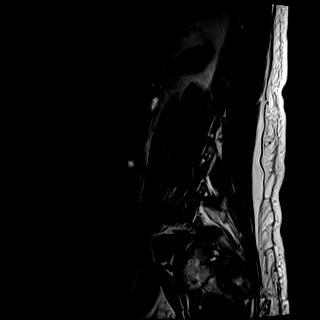
[im 3/15]
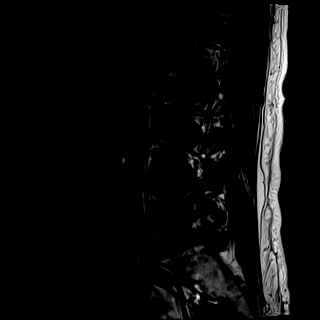
[im 5/15]
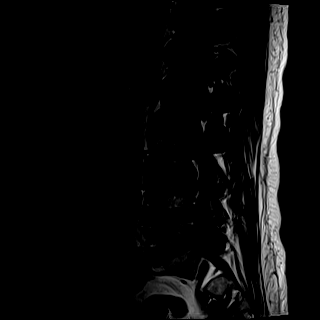
[im 8/15]
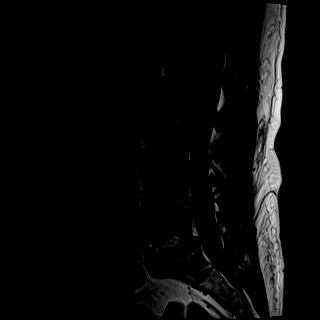
[im 12/15]
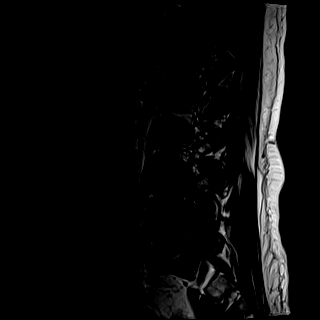

[Series 8: T2 · axial · 4.0mm · 0.28mm/px · z∈[-70,+100]mm · 8 of 31 slices shown (2 of 2)]
[im 1/31]
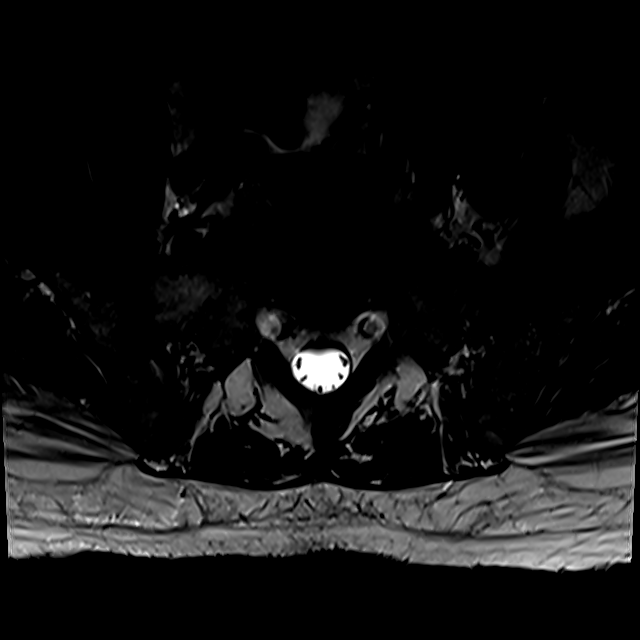
[im 5/31]
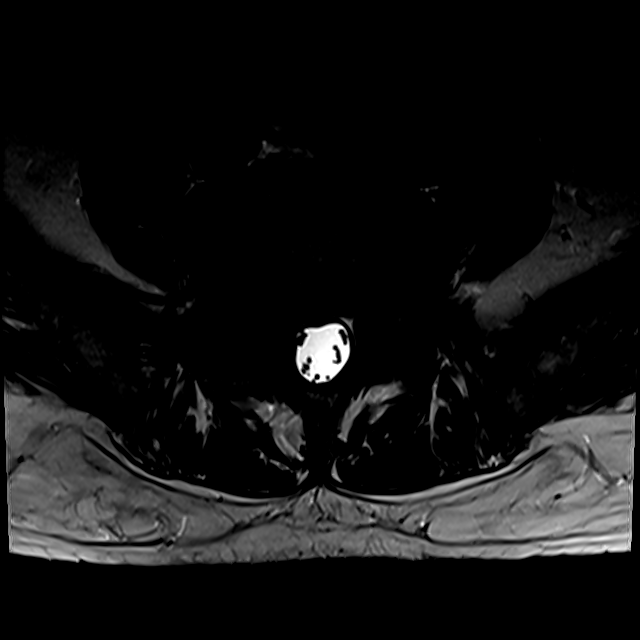
[im 10/31]
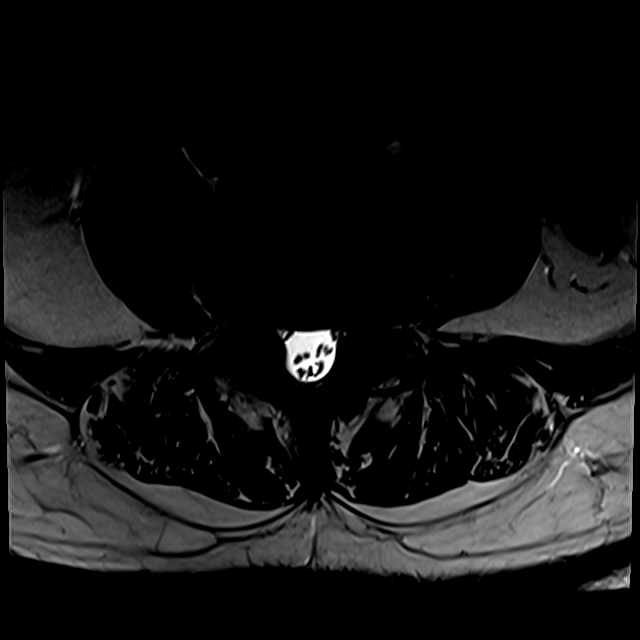
[im 14/31]
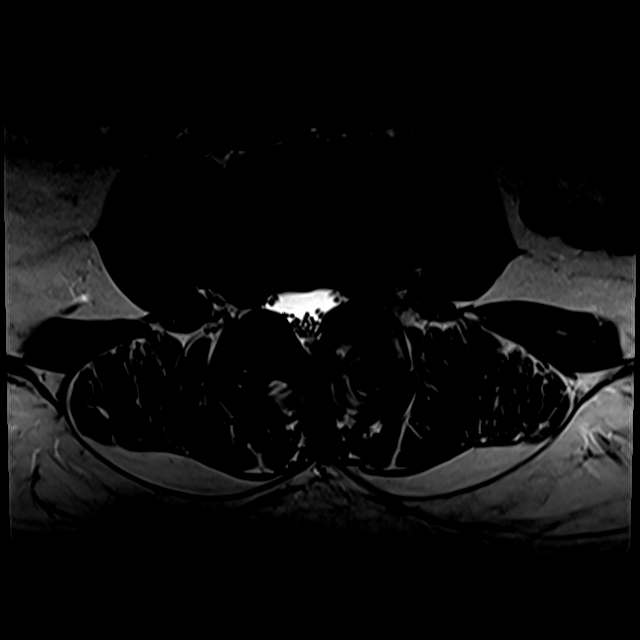
[im 17/31]
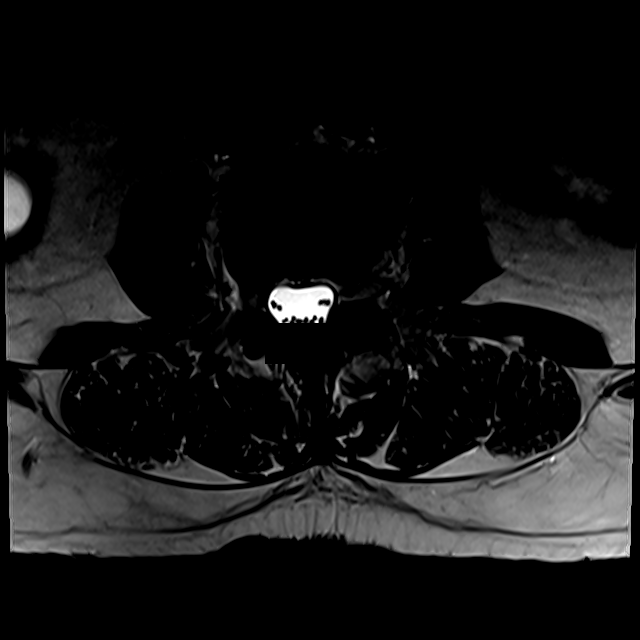
[im 21/31]
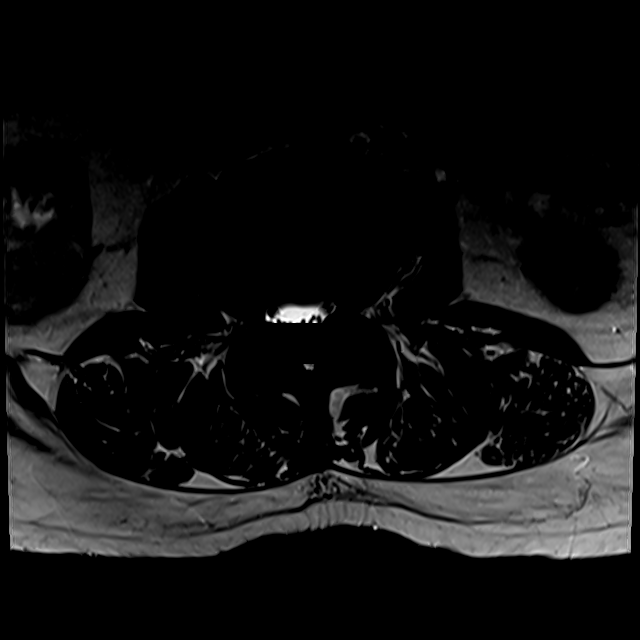
[im 26/31]
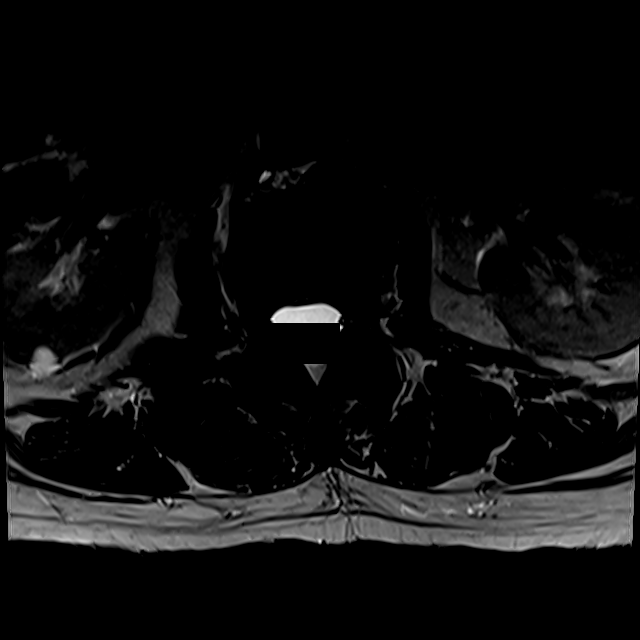
[im 31/31]
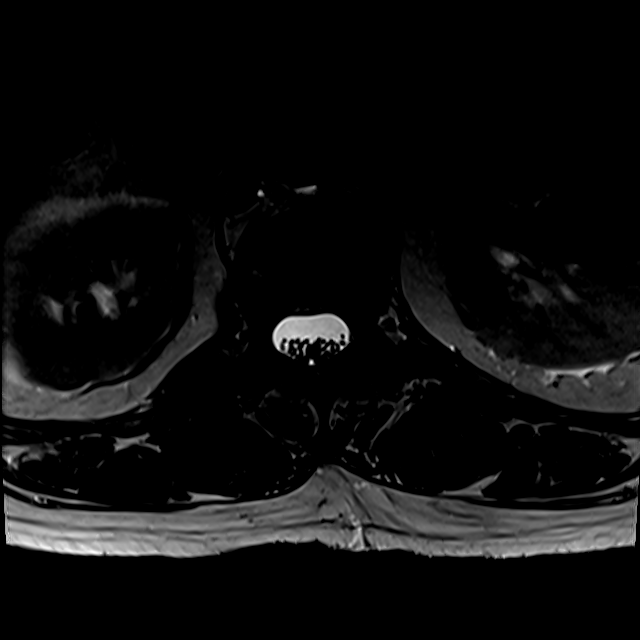

[Series 9: T1 · axial · 4.0mm · 0.59mm/px · z∈[-52,+75]mm · 3 of 31 slices shown (2 of 2)]
[im 5/31]
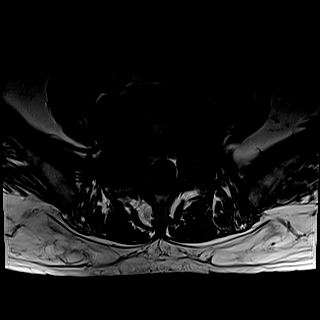
[im 17/31]
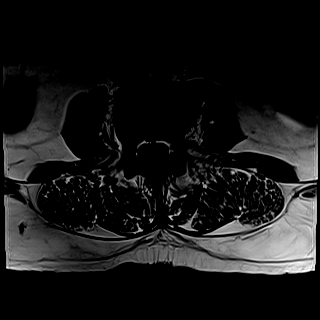
[im 26/31]
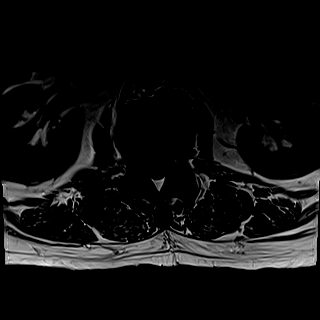

[22 of 48 positions shown; findings below may reference images not displayed]

FINDINGS: There is mild curvature convex to the right with the apex at L2.
There are non-compressive disc bulges at T11-12, T12-L1 and L1-2.
The distal cord and conus are normal with the conus tip at L1-2.

L2-3: Disc degeneration with endplate osteophytes and
circumferential bulging of the disc. Mild facet and ligamentous
hypertrophy. Mild narrowing of the lateral recesses without visible
neural compression. Chronic endplate marrow changes.

L3-4: Disc degeneration with circumferential protrusion of disc
material. Facet degeneration and hypertrophy left more than right.
Mild stenosis of both lateral recesses and neural foramina without
visible neural compression.

L4-5: Disc degeneration with endplate osteophytes and
circumferential protrusion of disc material more prominent towards
the left. Facet degeneration and hypertrophy. No central canal
stenosis. Foraminal stenosis bilaterally left worse than right.
Neural compression could occur in the foramina. Discogenic changes
affect the endplates, largely chronic, but with mild edema.

L5-S1: Disc degeneration with endplate osteophytes and
circumferential bulging of the disc. Bilateral facet degeneration
and hypertrophy. No central canal stenosis. Severe foraminal
stenosis bilaterally, worse on the right than the left. Either or
both L5 nerve roots could be compressed. Discogenic changes
affecting the endplates, largely chronic, but with mild edema.
IMPRESSION: No central canal stenosis.

Disc degeneration and facet degeneration at L2-3 and L3-4 but
without apparent compressive stenosis.

Disc degeneration and facet degeneration at L4-5 and L5-S1 with
foraminal stenosis bilaterally at those levels that could compress
either or both L4 and L5 nerve roots. This appears more severe at
L5-S1.

Endplate marrow changes that could be associated with chronic back
pain.

## 2016-04-21 ENCOUNTER — Ambulatory Visit (INDEPENDENT_AMBULATORY_CARE_PROVIDER_SITE_OTHER): Payer: Medicare Other | Admitting: Family Medicine

## 2016-04-21 ENCOUNTER — Encounter: Payer: Self-pay | Admitting: Family Medicine

## 2016-04-21 DIAGNOSIS — I10 Essential (primary) hypertension: Secondary | ICD-10-CM | POA: Diagnosis not present

## 2016-04-21 DIAGNOSIS — E876 Hypokalemia: Secondary | ICD-10-CM | POA: Diagnosis not present

## 2016-04-21 DIAGNOSIS — M545 Low back pain, unspecified: Secondary | ICD-10-CM

## 2016-04-21 DIAGNOSIS — N529 Male erectile dysfunction, unspecified: Secondary | ICD-10-CM

## 2016-04-21 LAB — BASIC METABOLIC PANEL
BUN: 8 mg/dL (ref 7–25)
CHLORIDE: 104 mmol/L (ref 98–110)
CO2: 27 mmol/L (ref 20–31)
Calcium: 9.5 mg/dL (ref 8.6–10.3)
Creat: 0.92 mg/dL (ref 0.70–1.33)
GLUCOSE: 95 mg/dL (ref 65–99)
POTASSIUM: 4.2 mmol/L (ref 3.5–5.3)
SODIUM: 139 mmol/L (ref 135–146)

## 2016-04-21 MED ORDER — HYDROCODONE-ACETAMINOPHEN 5-325 MG PO TABS: 1.0000 | ORAL_TABLET | Freq: Three times a day (TID) | ORAL | 0 refills | Status: DC | PRN

## 2016-04-21 MED ORDER — SILDENAFIL CITRATE 100 MG PO TABS: 100.0000 mg | ORAL_TABLET | Freq: Every day | ORAL | 11 refills | Status: DC | PRN

## 2016-04-21 NOTE — Patient Instructions (Addendum)
Good to see you today!  Thanks for coming in.  Call the gastroenterologist to schedule a colonscopy  For pain you can take up to 6 gabapentin at night and can take up to 3 capsules three times a day   I will prescribe 30 hydrocodone per month take the only as you need.  Do not get any narcotics from any other doctors   Come back in one month to see how you are doing and for refills

## 2016-04-21 NOTE — Progress Notes (Signed)
Subjective  Patient is presenting with the following illnesses  BACK PAIN Long history of back pain from cerebral palsy.  Has been to pain centers before and on chronic narcotics  Was to move but this fell through and he would like to reestablish with a pain center.  Has intermittent pain with movement.  Taking gabapentin and tramadol but some days does not give him enough relief to be active.   No radicular pain or weight loss or incontinence   ED Difficulty getting and maintaining erections for years.  Has not tried anything in the past.  Has a girlfriend now.  No dysuria or lower extremity weakness or change in hair growth  HYPERTENSION Disease Monitoring  Home BP Monitoring (Severity) home readings 127-146/70s-80s Symptoms - Chest pain- no    Dyspnea- no Medications (Modifying factors) Compliance-  Ran out of HCTZ about a month ago wants to try without it . Lightheadedness-  no  Edema- no Timing - continuous  Duration - years ROS - See HPI  HYPOKALEMIA Taking 3 K tablets.  Stopped hctz a few weeks ago.  No cramps or chest pain or palpitatons  PMH Lab Review   Potassium  Date Value Ref Range Status  04/21/2016 4.2 3.5 - 5.3 mmol/L Final   Sodium  Date Value Ref Range Status  04/21/2016 139 135 - 146 mmol/L Final   Creat  Date Value Ref Range Status  04/21/2016 0.92 0.70 - 1.33 mg/dL Final    Comment:      For patients > or = 52 years of age: The upper reference limit for Creatinine is approximately 13% higher for people identified as African-American.         Chief Complaint noted Review of Symptoms - see HPI PMH - Smoking status noted.     Objective Vital Signs reviewed Heart - Regular rate and rhythm.  No murmurs, gallops or rubs.    Lungs:  Normal respiratory effort, chest expands symmetrically. Lungs are clear to auscultation, no crackles or wheezes. Extremities:  No cyanosis, edema, or deformity noted with good range of motion of all major joints.   Walks  with a pronounced limp  Assessments/Plans  No problem-specific Assessment & Plan notes found for this encounter.   See Encounter view if individual problem A/Ps not visible See after visit summary for details of patient instuctions

## 2016-04-22 ENCOUNTER — Encounter: Payer: Self-pay | Admitting: Family Medicine

## 2016-04-22 ENCOUNTER — Telehealth: Payer: Self-pay | Admitting: Family Medicine

## 2016-04-22 NOTE — Telephone Encounter (Signed)
Not sure if I should just send this straight to Tamika, sorry if so!

## 2016-04-22 NOTE — Assessment & Plan Note (Addendum)
Good control off of HCTZ   Will continue his current regimen

## 2016-04-22 NOTE — Telephone Encounter (Signed)
[employer] needs prior authorization for sildenafil.  viagra is not covered.  [employer] will cover sildenafil citrate which is the generic for rebatio. Please call [phone] to authorize the prescription

## 2016-04-22 NOTE — Assessment & Plan Note (Signed)
Check labs 

## 2016-04-22 NOTE — Assessment & Plan Note (Addendum)
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   Not controlled well with gabapentin and tramadol.  Will start hydrocodone as needed an average of one per day.   Discussed this is a supplement and will not totally control his pain and we will not increase without an objective change or new painful condition.   Patient signed narcotics contract.  [place] Database checked and appropriate

## 2016-04-27 MED ORDER — SILDENAFIL CITRATE 20 MG PO TABS: ORAL_TABLET | ORAL | 2 refills | Status: DC

## 2016-04-27 NOTE — Telephone Encounter (Signed)
I sent in Rx for Sildenafil 20 mg tabs

## 2016-04-28 ENCOUNTER — Telehealth: Payer: Self-pay | Admitting: *Deleted

## 2016-04-28 NOTE — Telephone Encounter (Signed)
[facility]. Formulary and PA form placed in provider box for completion. Clovis PuMartin, Teyton Pattillo L, RN

## 2016-04-29 NOTE — Telephone Encounter (Signed)
Received a call from [facility] regarding PA for Sidenafil.  Per representative the PA need to be completed within 24 hours and use reference number: [id number].  Clovis PuMartin, Tamika L, RN

## 2016-04-29 NOTE — Telephone Encounter (Signed)
PA requires patient have pulmonary hypertension for Medicaid to pay for sildenafil Patient does not qualify

## 2016-05-05 NOTE — Telephone Encounter (Signed)
Received another PA for [facility]  PA is requesting to change the quantity.  Max daily dose of 3 tablets. Please advise.  Clovis PuMartin, Benancio Osmundson L, RN

## 2016-05-10 NOTE — Telephone Encounter (Signed)
[facility], requesting to change the quantity.  Please advise.  Clovis PuMartin, Jadira Nierman L, RN

## 2016-05-14 NOTE — Telephone Encounter (Signed)
Will forward to PCP.  Kagan Mutchler L, RN  

## 2016-05-14 NOTE — Telephone Encounter (Signed)
Patient calling regarding approval for sildenafil, wants to know when this will be done.

## 2016-05-20 NOTE — Telephone Encounter (Signed)
Patient does not have pulmonary hypertension so is not eligible for sildenafil  Please let him know  Thanks  LC

## 2016-05-26 ENCOUNTER — Ambulatory Visit (INDEPENDENT_AMBULATORY_CARE_PROVIDER_SITE_OTHER): Payer: Medicare Other | Admitting: Family Medicine

## 2016-05-26 ENCOUNTER — Encounter: Payer: Self-pay | Admitting: Family Medicine

## 2016-05-26 DIAGNOSIS — I10 Essential (primary) hypertension: Secondary | ICD-10-CM | POA: Diagnosis not present

## 2016-05-26 DIAGNOSIS — G8929 Other chronic pain: Secondary | ICD-10-CM

## 2016-05-26 DIAGNOSIS — Z23 Encounter for immunization: Secondary | ICD-10-CM

## 2016-05-26 DIAGNOSIS — M545 Low back pain: Secondary | ICD-10-CM | POA: Diagnosis not present

## 2016-05-26 MED ORDER — ROSUVASTATIN CALCIUM 10 MG PO TABS: 10.0000 mg | ORAL_TABLET | Freq: Every day | ORAL | 3 refills | Status: AC

## 2016-05-26 MED ORDER — HYDROCODONE-ACETAMINOPHEN 5-325 MG PO TABS: 1.0000 | ORAL_TABLET | Freq: Three times a day (TID) | ORAL | 0 refills | Status: DC | PRN

## 2016-05-26 NOTE — Progress Notes (Signed)
Subjective  Patient is presenting with the following illnesses  HYPERTENSION Disease Monitoring  Home BP Monitoring (Severity) not checking Symptoms - Chest pain- no    Dyspnea- no Medications (Modifying factors) Compliance-  daily. Lightheadedness-  no  Edema- no Timing - continuous  Duration - years ROS - See HPI  Back Pain Improved with medications.  No weakness no incontience  [place]   PMH Lab Review   Potassium  Date Value Ref Range Status  04/21/2016 4.2 3.5 - 5.3 mmol/L Final   Sodium  Date Value Ref Range Status  04/21/2016 139 135 - 146 mmol/L Final   Creat  Date Value Ref Range Status  04/21/2016 0.92 0.70 - 1.33 mg/dL Final    Comment:      For patients > or = 52 years of age: The upper reference limit for Creatinine is approximately 13% higher for people identified as African-American.              Chief Complaint noted Review of Symptoms - see HPI PMH - Smoking status noted.     Objective Vital Signs reviewed Alert nad Moving well about the exam room     Assessments/Plans  No problem-specific Assessment & Plan notes found for this encounter.   See Encounter view if individual problem A/Ps not visible See after visit summary for details of patient instuctions

## 2016-05-26 NOTE — Assessment & Plan Note (Signed)
BP Readings from Last 3 Encounters:  05/26/16 138/82  04/21/16 130/76  03/03/16 131/79   Good control off HCTZ

## 2016-05-26 NOTE — Patient Instructions (Signed)
Good to see you today!  Thanks for coming in.  Stop the lipitor and take Crestor 10 mg every day. Let me know if it is causing any side effects  Still remember you need a colonoscopy to prevent colon cancer  [place]  I will send your new doctor your records as needed

## 2016-05-26 NOTE — Assessment & Plan Note (Signed)
Stable on low dose Hydrocodone

## 2016-05-26 NOTE — Telephone Encounter (Signed)
Pt was seen in clinic today and medication was addressed.

## 2016-06-09 ENCOUNTER — Ambulatory Visit: Payer: Medicare Other | Admitting: Family Medicine

## 2016-06-16 ENCOUNTER — Encounter: Payer: Self-pay | Admitting: Family Medicine

## 2016-06-16 ENCOUNTER — Other Ambulatory Visit (HOSPITAL_COMMUNITY)
Admission: RE | Admit: 2016-06-16 | Discharge: 2016-06-16 | Disposition: A | Discharge status: Home / Self Care | Payer: Medicare Other | Source: Ambulatory Visit | Attending: Family Medicine | Admitting: Family Medicine

## 2016-06-16 ENCOUNTER — Ambulatory Visit (INDEPENDENT_AMBULATORY_CARE_PROVIDER_SITE_OTHER): Payer: Medicare Other | Admitting: Family Medicine

## 2016-06-16 VITALS — Ht 63.0 in | Wt 163.0 lb

## 2016-06-16 DIAGNOSIS — Z114 Encounter for screening for human immunodeficiency virus [HIV]: Secondary | ICD-10-CM | POA: Diagnosis not present

## 2016-06-16 DIAGNOSIS — R39198 Other difficulties with micturition: Secondary | ICD-10-CM | POA: Diagnosis not present

## 2016-06-16 DIAGNOSIS — Z125 Encounter for screening for malignant neoplasm of prostate: Secondary | ICD-10-CM | POA: Diagnosis not present

## 2016-06-16 DIAGNOSIS — Z113 Encounter for screening for infections with a predominantly sexual mode of transmission: Secondary | ICD-10-CM | POA: Insufficient documentation

## 2016-06-16 LAB — POCT URINALYSIS DIPSTICK
BILIRUBIN UA: NEGATIVE
Glucose, UA: NEGATIVE
KETONES UA: NEGATIVE
Leukocytes, UA: NEGATIVE
Nitrite, UA: NEGATIVE
PH UA: 7
PROTEIN UA: NEGATIVE
RBC UA: NEGATIVE
SPEC GRAV UA: 1.02
Urobilinogen, UA: 1

## 2016-06-16 LAB — PSA: PSA: 1.8 ng/mL (ref <=4.0)

## 2016-06-16 MED ORDER — HYDROCODONE-ACETAMINOPHEN 5-325 MG PO TABS: 1.0000 | ORAL_TABLET | Freq: Three times a day (TID) | ORAL | 0 refills | Status: DC | PRN

## 2016-06-16 NOTE — Assessment & Plan Note (Signed)
Seems most consistent with BPH. Will check UA and labs.  Patient is moving from [place] soon.  He wishes a phone call not a letter about his labs

## 2016-06-16 NOTE — Patient Instructions (Addendum)
Good to see you today!  Thanks for coming in.  I will call you with the results of the tests to see if we need to treat anything  I would take the gabapentin for pain and take less hydrocodone or tramadol  Get you Crestor filled very soon - that is important to keep from having a stroke  MIght as well stop smoking completely

## 2016-06-16 NOTE — Progress Notes (Signed)
Subjective  Patient is presenting with the following illnesses  Difficulty Urinating Has noticed decrease FOS and hesitancy for at least a month.  No discharge or dysuria or sores or specific concern for contact of STD.  No fever or back pain. No trauma.  No change in medications   Chief Complaint noted Review of Symptoms - see HPI PMH - Smoking status noted.     Objective Vital Signs reviewed Alert nad Male genitalia: not done no penile lesions or discharge no testicular masses no bladder distension noted Rectal examination: Prostate nontender midly enlarged     Assessments/Plans  No problem-specific Assessment & Plan notes found for this encounter.   See Encounter view if individual problem A/Ps not visible See after visit summary for details of patient instuctions

## 2016-06-17 LAB — HIV ANTIBODY (ROUTINE TESTING W REFLEX): HIV 1&2 Ab, 4th Generation: NONREACTIVE

## 2016-06-17 LAB — URINE CYTOLOGY ANCILLARY ONLY
Chlamydia: NEGATIVE
Neisseria Gonorrhea: NEGATIVE
Trichomonas: NEGATIVE

## 2016-06-17 LAB — RPR

## 2016-06-18 ENCOUNTER — Telehealth: Payer: Self-pay | Admitting: Family Medicine

## 2016-06-18 NOTE — Telephone Encounter (Signed)
Told all tests ok  His urination is improved  Advised to follow up with his new doctor when he moves or to return to see me in 2 months

## 2016-06-18 NOTE — Telephone Encounter (Signed)
Patient request labs to be released so he can view his results on MyChart.

## 2016-07-23 ENCOUNTER — Other Ambulatory Visit: Payer: Self-pay | Admitting: Family Medicine

## 2016-07-28 ENCOUNTER — Ambulatory Visit (INDEPENDENT_AMBULATORY_CARE_PROVIDER_SITE_OTHER): Payer: Medicare Other | Admitting: Family Medicine

## 2016-07-28 ENCOUNTER — Encounter: Payer: Self-pay | Admitting: Family Medicine

## 2016-07-28 DIAGNOSIS — I10 Essential (primary) hypertension: Secondary | ICD-10-CM

## 2016-07-28 DIAGNOSIS — E782 Mixed hyperlipidemia: Secondary | ICD-10-CM

## 2016-07-28 DIAGNOSIS — G8929 Other chronic pain: Secondary | ICD-10-CM | POA: Diagnosis not present

## 2016-07-28 DIAGNOSIS — M545 Low back pain: Secondary | ICD-10-CM

## 2016-07-28 MED ORDER — HYDROCODONE-ACETAMINOPHEN 5-325 MG PO TABS: 1.0000 | ORAL_TABLET | Freq: Three times a day (TID) | ORAL | 0 refills | Status: AC | PRN

## 2016-07-28 MED ORDER — TRAMADOL HCL 50 MG PO TABS: 50.0000 mg | ORAL_TABLET | Freq: Three times a day (TID) | ORAL | 1 refills | Status: AC | PRN

## 2016-07-28 MED ORDER — LISINOPRIL 40 MG PO TABS: 40.0000 mg | ORAL_TABLET | Freq: Every day | ORAL | 1 refills | Status: AC

## 2016-07-28 MED ORDER — GABAPENTIN 100 MG PO CAPS: 300.0000 mg | ORAL_CAPSULE | Freq: Every day | ORAL | 3 refills | Status: AC

## 2016-07-28 NOTE — Patient Instructions (Addendum)
Good to see you today!  Thanks for coming in.  You will look up a gastroenterologist who does colonoscopy and let me know and I will refer you  Make sure you are taking Crestor every day as well as the Asprin to prevent strokes  For pain take the gabapentin three times a day and as needed Tramadol and hydrocodone  Come back in 2 months

## 2016-07-28 NOTE — Assessment & Plan Note (Signed)
Not controlled,  Long discussion about importance of statin given his history of stroke

## 2016-07-28 NOTE — Assessment & Plan Note (Signed)
BP Readings from Last 3 Encounters:  07/28/16 140/68  05/26/16 138/82  04/21/16 130/76   Encouraged to take all his medications daily Reasonable control today

## 2016-07-28 NOTE — Assessment & Plan Note (Addendum)
Chronic stable.  Seems to be taking his medications appropriately except recommend he restart the gabapentin

## 2016-07-28 NOTE — Progress Notes (Signed)
Subjective  Patient is presenting with the following illnesses  Back hip pain About the same worsens when moves after sitting for awhile.  Has not been taking gabapentin.  Using tramadol and hydrocodone as needed to keep active.  No soft tissue swelling or fevers rashes  HYPERTENSION  Disease Monitoring  Home BP Monitoring (Severity) not checking Symptoms - Chest pain- no    Dyspnea- no Medications (Modifying factors) Compliance-  Ran out of lisinopril. Lightheadedness-  no  Edema- no Timing - continuous  Duration - years ROS - See HPI  HYPERLIPIDEMIA Symptoms Chest pain on exertion:  no   Leg claudication:   no Medications (modifying factor): Compliance- has not been taking crestor never got it filled Right upper quadrant pain- no  Muscle aches- not new Duration - years   Timing - continuous    Component Value Date/Time   CHOL 195 02/21/2015 0524   TRIG 63 02/21/2015 0524   HDL 39 (L) 02/21/2015 0524   VLDL 13 02/21/2015 0524   CHOLHDL 5.0 02/21/2015 0524     PMH Lab Review   Potassium  Date Value Ref Range Status  04/21/2016 4.2 3.5 - 5.3 mmol/L Final   Sodium  Date Value Ref Range Status  04/21/2016 139 135 - 146 mmol/L Final   Creat  Date Value Ref Range Status  04/21/2016 0.92 0.70 - 1.33 mg/dL Final    Comment:      For patients > or = 52 years of age: The upper reference limit for Creatinine is approximately 13% higher for people identified as African-American.               Chief Complaint noted Review of Symptoms - see HPI PMH - Smoking status noted.     Objective Vital Signs reviewed Alert nad Heart - Regular rate and rhythm.  No murmurs, gallops or rubs.    Lungs:  Normal respiratory effort, chest expands symmetrically. Lungs are clear to auscultation, no crackles or wheezes.    Assessments/Plans  No problem-specific Assessment & Plan notes found for this encounter.   See Encounter view if individual problem A/Ps not visible See  after visit summary for details of patient instuctions

## 2016-09-07 DIAGNOSIS — M4180 Other forms of scoliosis, site unspecified: Secondary | ICD-10-CM | POA: Diagnosis not present

## 2016-09-07 DIAGNOSIS — Z79899 Other long term (current) drug therapy: Secondary | ICD-10-CM | POA: Diagnosis not present

## 2016-09-07 DIAGNOSIS — G890 Central pain syndrome: Secondary | ICD-10-CM | POA: Diagnosis not present

## 2016-09-07 DIAGNOSIS — G800 Spastic quadriplegic cerebral palsy: Secondary | ICD-10-CM | POA: Diagnosis not present

## 2016-10-05 DIAGNOSIS — Z79899 Other long term (current) drug therapy: Secondary | ICD-10-CM | POA: Diagnosis not present

## 2016-10-05 DIAGNOSIS — M419 Scoliosis, unspecified: Secondary | ICD-10-CM | POA: Diagnosis not present

## 2016-10-05 DIAGNOSIS — G8929 Other chronic pain: Secondary | ICD-10-CM | POA: Diagnosis not present

## 2016-10-05 DIAGNOSIS — M549 Dorsalgia, unspecified: Secondary | ICD-10-CM | POA: Diagnosis not present

## 2016-10-06 DIAGNOSIS — E119 Type 2 diabetes mellitus without complications: Secondary | ICD-10-CM | POA: Diagnosis not present

## 2016-10-06 DIAGNOSIS — E559 Vitamin D deficiency, unspecified: Secondary | ICD-10-CM | POA: Diagnosis not present

## 2016-10-06 DIAGNOSIS — Z8673 Personal history of transient ischemic attack (TIA), and cerebral infarction without residual deficits: Secondary | ICD-10-CM | POA: Diagnosis not present

## 2016-10-06 DIAGNOSIS — M419 Scoliosis, unspecified: Secondary | ICD-10-CM | POA: Diagnosis not present

## 2016-10-06 DIAGNOSIS — I10 Essential (primary) hypertension: Secondary | ICD-10-CM | POA: Diagnosis not present

## 2016-10-06 DIAGNOSIS — E785 Hyperlipidemia, unspecified: Secondary | ICD-10-CM | POA: Diagnosis not present

## 2016-11-11 DIAGNOSIS — I10 Essential (primary) hypertension: Secondary | ICD-10-CM | POA: Diagnosis not present

## 2016-11-11 DIAGNOSIS — Z79899 Other long term (current) drug therapy: Secondary | ICD-10-CM | POA: Diagnosis not present

## 2016-11-11 DIAGNOSIS — E0590 Thyrotoxicosis, unspecified without thyrotoxic crisis or storm: Secondary | ICD-10-CM | POA: Diagnosis not present

## 2016-12-07 DIAGNOSIS — M549 Dorsalgia, unspecified: Secondary | ICD-10-CM | POA: Diagnosis not present

## 2016-12-08 ENCOUNTER — Ambulatory Visit: Payer: Medicare Other | Admitting: Family Medicine

## 2016-12-31 DIAGNOSIS — G894 Chronic pain syndrome: Secondary | ICD-10-CM | POA: Diagnosis not present

## 2016-12-31 DIAGNOSIS — M419 Scoliosis, unspecified: Secondary | ICD-10-CM | POA: Diagnosis not present

## 2017-01-24 ENCOUNTER — Other Ambulatory Visit: Payer: Self-pay | Admitting: Family Medicine

## 2017-01-28 DIAGNOSIS — M549 Dorsalgia, unspecified: Secondary | ICD-10-CM | POA: Diagnosis not present

## 2017-04-15 DIAGNOSIS — Z79899 Other long term (current) drug therapy: Secondary | ICD-10-CM | POA: Diagnosis not present

## 2017-04-15 DIAGNOSIS — M25571 Pain in right ankle and joints of right foot: Secondary | ICD-10-CM | POA: Diagnosis not present

## 2017-04-15 DIAGNOSIS — Z720 Tobacco use: Secondary | ICD-10-CM | POA: Diagnosis not present

## 2017-04-15 DIAGNOSIS — Z7982 Long term (current) use of aspirin: Secondary | ICD-10-CM | POA: Diagnosis not present

## 2017-11-02 ENCOUNTER — Telehealth: Payer: Self-pay | Admitting: Family Medicine

## 2017-11-02 NOTE — Telephone Encounter (Signed)
Left patient a voice mail
